# Patient Record
Sex: Female | Born: 1965 | Race: White | Hispanic: No | Marital: Single
Health system: Southern US, Community
[De-identification: ages and names within clinical notes are randomized; demographics above are authoritative.]

## PROBLEM LIST (undated history)

## (undated) DIAGNOSIS — E119 Type 2 diabetes mellitus without complications: Secondary | ICD-10-CM

## (undated) DIAGNOSIS — F419 Anxiety disorder, unspecified: Secondary | ICD-10-CM

## (undated) DIAGNOSIS — I10 Essential (primary) hypertension: Secondary | ICD-10-CM

## (undated) HISTORY — PX: REDUCTION MAMMAPLASTY: SUR839

## (undated) HISTORY — PX: GASTRIC BYPASS: SHX52

---

## 1999-12-12 ENCOUNTER — Other Ambulatory Visit: Admission: RE | Admit: 1999-12-12 | Discharge: 1999-12-12 | Payer: Self-pay | Admitting: *Deleted

## 2000-12-05 ENCOUNTER — Encounter: Payer: Self-pay | Admitting: Emergency Medicine

## 2000-12-05 ENCOUNTER — Inpatient Hospital Stay (HOSPITAL_COMMUNITY): Admission: EM | Admit: 2000-12-05 | Discharge: 2000-12-13 | Payer: Self-pay | Admitting: Emergency Medicine

## 2000-12-05 ENCOUNTER — Encounter: Payer: Self-pay | Admitting: Internal Medicine

## 2000-12-06 ENCOUNTER — Encounter: Payer: Self-pay | Admitting: Internal Medicine

## 2000-12-10 ENCOUNTER — Encounter: Payer: Self-pay | Admitting: Internal Medicine

## 2001-03-26 ENCOUNTER — Other Ambulatory Visit: Admission: RE | Admit: 2001-03-26 | Discharge: 2001-03-26 | Payer: Self-pay | Admitting: *Deleted

## 2002-03-25 ENCOUNTER — Other Ambulatory Visit: Admission: RE | Admit: 2002-03-25 | Discharge: 2002-03-25 | Payer: Self-pay | Admitting: *Deleted

## 2003-07-01 ENCOUNTER — Other Ambulatory Visit: Admission: RE | Admit: 2003-07-01 | Discharge: 2003-07-01 | Payer: Self-pay | Admitting: Obstetrics and Gynecology

## 2005-05-08 ENCOUNTER — Other Ambulatory Visit: Admission: RE | Admit: 2005-05-08 | Discharge: 2005-05-08 | Payer: Self-pay | Admitting: Obstetrics and Gynecology

## 2005-05-12 ENCOUNTER — Ambulatory Visit: Payer: Self-pay | Admitting: Oncology

## 2005-06-27 ENCOUNTER — Ambulatory Visit: Payer: Self-pay | Admitting: Oncology

## 2005-06-27 LAB — CBC WITH DIFFERENTIAL/PLATELET
BASO%: 0.2 % (ref 0.0–2.0)
Basophils Absolute: 0 10*3/uL (ref 0.0–0.1)
EOS%: 2 % (ref 0.0–7.0)
Eosinophils Absolute: 0.2 10*3/uL (ref 0.0–0.5)
HCT: 44.2 % (ref 34.8–46.6)
HGB: 14.9 g/dL (ref 11.6–15.9)
LYMPH%: 28.6 % (ref 14.0–48.0)
MCH: 30.5 pg (ref 26.0–34.0)
MCHC: 33.7 g/dL (ref 32.0–36.0)
MCV: 90.5 fL (ref 81.0–101.0)
MONO#: 0.5 10*3/uL (ref 0.1–0.9)
MONO%: 5.1 % (ref 0.0–13.0)
NEUT#: 6.9 10*3/uL — ABNORMAL HIGH (ref 1.5–6.5)
NEUT%: 64.1 % (ref 39.6–76.8)
Platelets: 344 10*3/uL (ref 145–400)
RBC: 4.88 10*6/uL (ref 3.70–5.32)
RDW: 13.4 % (ref 11.3–14.5)
WBC: 10.7 10*3/uL — ABNORMAL HIGH (ref 3.9–10.0)
lymph#: 3.1 10*3/uL (ref 0.9–3.3)

## 2005-06-30 LAB — HYPERCOAGULABLE PANEL, COMPREHENSIVE
AntiThromb III Func: 110 % (ref 75–120)
Beta-2-Glycoprotein I IgA: 7 U/mL (ref <10)
Beta-2-Glycoprotein I IgM: 5 U/mL (ref <10)
Homocysteine: 7.6 umol/L (ref 4.0–15.4)
Protein S Activity: 135 % (ref 81–180)
Protein S Ag, Total: 112 % (ref 58–146)

## 2005-06-30 LAB — COMPREHENSIVE METABOLIC PANEL
ALT: 36 U/L (ref 0–40)
Albumin: 4.7 g/dL (ref 3.5–5.2)
CO2: 26 mEq/L (ref 19–32)
Calcium: 9.6 mg/dL (ref 8.4–10.5)
Chloride: 101 mEq/L (ref 96–112)
Creatinine, Ser: 0.89 mg/dL (ref 0.40–1.20)
Sodium: 138 mEq/L (ref 135–145)
Total Protein: 7.8 g/dL (ref 6.0–8.3)

## 2005-07-25 ENCOUNTER — Ambulatory Visit: Admission: RE | Admit: 2005-07-25 | Discharge: 2005-07-25 | Payer: Self-pay | Admitting: Gynecologic Oncology

## 2005-08-01 ENCOUNTER — Inpatient Hospital Stay (HOSPITAL_COMMUNITY): Admission: RE | Admit: 2005-08-01 | Discharge: 2005-08-03 | Payer: Self-pay | Admitting: Obstetrics and Gynecology

## 2005-08-01 ENCOUNTER — Encounter (INDEPENDENT_AMBULATORY_CARE_PROVIDER_SITE_OTHER): Payer: Self-pay | Admitting: *Deleted

## 2005-09-05 ENCOUNTER — Ambulatory Visit: Admission: RE | Admit: 2005-09-05 | Discharge: 2005-09-05 | Payer: Self-pay | Admitting: Gynecology

## 2006-03-13 ENCOUNTER — Ambulatory Visit: Admission: RE | Admit: 2006-03-13 | Discharge: 2006-03-13 | Payer: Self-pay | Admitting: Gynecologic Oncology

## 2006-03-13 ENCOUNTER — Encounter (INDEPENDENT_AMBULATORY_CARE_PROVIDER_SITE_OTHER): Payer: Self-pay | Admitting: *Deleted

## 2006-03-13 ENCOUNTER — Other Ambulatory Visit: Admission: RE | Admit: 2006-03-13 | Discharge: 2006-03-13 | Payer: Self-pay | Admitting: Gynecologic Oncology

## 2007-03-07 ENCOUNTER — Encounter: Admission: RE | Admit: 2007-03-07 | Discharge: 2007-03-07 | Payer: Self-pay | Admitting: Internal Medicine

## 2007-03-21 ENCOUNTER — Ambulatory Visit: Payer: Self-pay | Admitting: Pulmonary Disease

## 2007-03-21 DIAGNOSIS — G4710 Hypersomnia, unspecified: Secondary | ICD-10-CM | POA: Insufficient documentation

## 2007-03-21 DIAGNOSIS — I10 Essential (primary) hypertension: Secondary | ICD-10-CM | POA: Insufficient documentation

## 2007-03-21 DIAGNOSIS — G4730 Sleep apnea, unspecified: Secondary | ICD-10-CM

## 2007-03-21 DIAGNOSIS — E785 Hyperlipidemia, unspecified: Secondary | ICD-10-CM

## 2007-03-27 ENCOUNTER — Ambulatory Visit (HOSPITAL_BASED_OUTPATIENT_CLINIC_OR_DEPARTMENT_OTHER): Admission: RE | Admit: 2007-03-27 | Discharge: 2007-03-27 | Payer: Self-pay | Admitting: Pulmonary Disease

## 2007-03-27 ENCOUNTER — Encounter: Payer: Self-pay | Admitting: Pulmonary Disease

## 2007-04-02 ENCOUNTER — Ambulatory Visit: Payer: Self-pay | Admitting: Pulmonary Disease

## 2007-04-02 DIAGNOSIS — G4700 Insomnia, unspecified: Secondary | ICD-10-CM

## 2007-04-08 ENCOUNTER — Encounter: Payer: Self-pay | Admitting: Pulmonary Disease

## 2007-04-10 ENCOUNTER — Ambulatory Visit: Payer: Self-pay | Admitting: Pulmonary Disease

## 2007-04-15 ENCOUNTER — Encounter: Payer: Self-pay | Admitting: Pulmonary Disease

## 2007-04-18 ENCOUNTER — Encounter: Payer: Self-pay | Admitting: Pulmonary Disease

## 2007-05-09 ENCOUNTER — Ambulatory Visit: Payer: Self-pay | Admitting: Pulmonary Disease

## 2007-05-23 ENCOUNTER — Encounter: Payer: Self-pay | Admitting: Pulmonary Disease

## 2007-10-23 ENCOUNTER — Other Ambulatory Visit: Admission: RE | Admit: 2007-10-23 | Discharge: 2007-10-23 | Payer: Self-pay | Admitting: Gynecology

## 2007-10-23 ENCOUNTER — Encounter: Payer: Self-pay | Admitting: Gynecology

## 2007-10-23 ENCOUNTER — Ambulatory Visit: Admission: RE | Admit: 2007-10-23 | Discharge: 2007-10-23 | Payer: Self-pay | Admitting: Gynecology

## 2009-06-28 ENCOUNTER — Ambulatory Visit (HOSPITAL_COMMUNITY)
Admission: RE | Admit: 2009-06-28 | Discharge: 2009-06-28 | Payer: Self-pay | Source: Home / Self Care | Admitting: Surgery

## 2009-07-07 ENCOUNTER — Ambulatory Visit (HOSPITAL_COMMUNITY): Admission: RE | Admit: 2009-07-07 | Discharge: 2009-07-07 | Payer: Self-pay | Admitting: Surgery

## 2009-08-02 ENCOUNTER — Encounter
Admission: RE | Admit: 2009-08-02 | Discharge: 2009-10-15 | Payer: Self-pay | Source: Home / Self Care | Admitting: Surgery

## 2009-09-27 ENCOUNTER — Inpatient Hospital Stay (HOSPITAL_COMMUNITY): Admission: RE | Admit: 2009-09-27 | Discharge: 2009-09-29 | Payer: Self-pay | Admitting: Surgery

## 2009-09-28 ENCOUNTER — Encounter (INDEPENDENT_AMBULATORY_CARE_PROVIDER_SITE_OTHER): Payer: Self-pay | Admitting: Surgery

## 2009-09-28 ENCOUNTER — Ambulatory Visit: Payer: Self-pay | Admitting: Surgery

## 2009-10-12 ENCOUNTER — Encounter: Admission: RE | Admit: 2009-10-12 | Discharge: 2009-10-15 | Payer: Self-pay | Admitting: Surgery

## 2009-11-23 ENCOUNTER — Encounter
Admission: RE | Admit: 2009-11-23 | Discharge: 2010-01-24 | Payer: Self-pay | Source: Home / Self Care | Attending: Surgery | Admitting: Surgery

## 2010-03-31 LAB — CBC
HCT: 36.7 % (ref 36.0–46.0)
Hemoglobin: 13 g/dL (ref 12.0–15.0)
MCH: 31.2 pg (ref 26.0–34.0)
MCH: 31.7 pg (ref 26.0–34.0)
MCHC: 34.2 g/dL (ref 30.0–36.0)
MCV: 90.8 fL (ref 78.0–100.0)
MCV: 91.1 fL (ref 78.0–100.0)
RBC: 4.08 MIL/uL (ref 3.87–5.11)
RBC: 4.26 MIL/uL (ref 3.87–5.11)
RDW: 12.1 % (ref 11.5–15.5)
RDW: 12.9 % (ref 11.5–15.5)
WBC: 13.4 10*3/uL — ABNORMAL HIGH (ref 4.0–10.5)
WBC: 7.9 10*3/uL (ref 4.0–10.5)

## 2010-03-31 LAB — DIFFERENTIAL
Basophils Absolute: 0 10*3/uL (ref 0.0–0.1)
Basophils Absolute: 0.3 10*3/uL — ABNORMAL HIGH (ref 0.0–0.1)
Eosinophils Absolute: 0 10*3/uL (ref 0.0–0.7)
Eosinophils Absolute: 0.2 10*3/uL (ref 0.0–0.7)
Eosinophils Relative: 2 % (ref 0–5)
Lymphocytes Relative: 25 % (ref 12–46)
Lymphs Abs: 1.6 10*3/uL (ref 0.7–4.0)
Lymphs Abs: 3.3 10*3/uL (ref 0.7–4.0)
Monocytes Absolute: 1.1 10*3/uL — ABNORMAL HIGH (ref 0.1–1.0)
Monocytes Relative: 9 % (ref 3–12)
Neutro Abs: 12.1 10*3/uL — ABNORMAL HIGH (ref 1.7–7.7)
Neutro Abs: 5.1 10*3/uL (ref 1.7–7.7)
Neutro Abs: 8.6 10*3/uL — ABNORMAL HIGH (ref 1.7–7.7)
Neutrophils Relative %: 65 % (ref 43–77)
Neutrophils Relative %: 85 % — ABNORMAL HIGH (ref 43–77)

## 2010-03-31 LAB — GLUCOSE, CAPILLARY
Glucose-Capillary: 105 mg/dL — ABNORMAL HIGH (ref 70–99)
Glucose-Capillary: 117 mg/dL — ABNORMAL HIGH (ref 70–99)
Glucose-Capillary: 123 mg/dL — ABNORMAL HIGH (ref 70–99)
Glucose-Capillary: 127 mg/dL — ABNORMAL HIGH (ref 70–99)
Glucose-Capillary: 139 mg/dL — ABNORMAL HIGH (ref 70–99)
Glucose-Capillary: 187 mg/dL — ABNORMAL HIGH (ref 70–99)

## 2010-03-31 LAB — COMPREHENSIVE METABOLIC PANEL
AST: 99 U/L — ABNORMAL HIGH (ref 0–37)
Albumin: 4.3 g/dL (ref 3.5–5.2)
Alkaline Phosphatase: 56 U/L (ref 39–117)
BUN: 9 mg/dL (ref 6–23)
Calcium: 9.5 mg/dL (ref 8.4–10.5)
Glucose, Bld: 125 mg/dL — ABNORMAL HIGH (ref 70–99)
Potassium: 4.3 mEq/L (ref 3.5–5.1)
Sodium: 137 mEq/L (ref 135–145)
Total Bilirubin: 0.8 mg/dL (ref 0.3–1.2)
Total Protein: 8 g/dL (ref 6.0–8.3)

## 2010-03-31 LAB — HEMOGLOBIN AND HEMATOCRIT, BLOOD
HCT: 39.6 % (ref 36.0–46.0)
HCT: 39.9 % (ref 36.0–46.0)
Hemoglobin: 13.6 g/dL (ref 12.0–15.0)

## 2010-03-31 LAB — SURGICAL PCR SCREEN: Staphylococcus aureus: NEGATIVE

## 2010-04-26 ENCOUNTER — Encounter: Admit: 2010-04-26 | Payer: Self-pay | Admitting: Surgery

## 2010-04-26 ENCOUNTER — Ambulatory Visit: Payer: Self-pay | Admitting: *Deleted

## 2010-05-31 NOTE — Procedures (Signed)
NAMEHARRIETT, [name]            ACCOUNT NO.:  [id number]   MEDICAL RECORD NO.:  [id number]          PATIENT TYPE:  OUT   LOCATION:  SLEEP CENTER                 FACILITY:  [facility]   PHYSICIAN:  Oretha Milch, MD      DATE OF BIRTH:  1965-09-26   DATE OF STUDY:  03/27/2007                            NOCTURNAL POLYSOMNOGRAM   REFERRING PHYSICIAN:   INDICATION FOR STUDY:  Witnessed apneas, loud snoring, excessive daytime  somnolence and fatigue in this 45 year old woman with a BMI of 43.  Height 5 feet 1 inch, weight 230 pounds.  Neck size 16 inches..   EPWORTH SLEEPINESS SCORE:  12/24.   MEDICATIONS:  Venlafaxine, benazepril, fish oil and aspirin.   This overnight polysomnogram was performed as a split intervention study  with a sleep technologist in attendance.  EEG, EOG, EMG, EKG and  respiratory data were recorded.  Sleep stages, arousals and respiratory  parameters were scored according to criteria laid out by the American  Academy of Sleep Medicine.   SLEEP ARCHITECTURE:  Lights out was at 2316 p.m.  Sleep latency was 13  minutes during the baseline period.  One hundred thirty-five minutes of  sleep were recorded with a sleep period time of 148.5 minutes and a  sleep efficiency of 83.6%.  No slow wave or REM sleep was noted.  Sleep  stages as a percentage of total sleep time was N1 14.4% and N2 85.6%.  No supine sleep was noted during this period.   During the titration period, 35 minutes of REM (18.5%) was observed.  The longest period of REM sleep was at 4 a.m.  CPAP was initiated at  2:06 a.m.   Arousal data:  During the baseline period, 58 arousals were noted with a  total arousal index of 25.8 events per hour.  Of these, 26 were  spontaneous and 32 were associated with respiratory events.  A few other  arousals were noted during the CPAP titration portion of the study.   RESPIRATORY DATA:  During the baseline period, 26 obstructive apneas and  130 hypopneas were  observed, leading to an AHI of 69.3 events per hour.  The longest apnea duration was 21 seconds and the longest hypopnea  duration was 33.6 seconds.   Due to this degree of respiratory disturbance, CPAP was initiated at +5  cm at 2:06 a.m.  Due to respiratory events and audible snoring, CPAP was  titrated upward.  At a level of +16 cm, she achieved 19.5 minutes of REM  sleep; however, 11 hypopneas and one obstructive apnea were noted at  this level with a desaturation of 86%.   Hence, CPAP was titrated upwards to +18 cm.  At this level for 51  minutes of sleep, she achieved 9 minutes of REM supine sleep.  Only two  hypopneas were noted at this level, with the lowest desaturation of 93%  and an AHI of 2.4 events per hour.  This appears to be the optimal level  during this study.   OXYGEN DATA:  Sixty-eight desaturations were noted during the diagnostic  portion with a low oxygen saturation of 83%.  She spent a total  of 3.8  minutes during the diagnostic portion with a saturation less than 88%.   CARDIAC DATA:  During the diagnostic portion, the low heart rate was  52.4 beats per minute and the high heart rate was 121.2 beats per minute  during non-REM sleep.   MOVEMENT-PARASOMNIA:  Four periodic limb movements were noted with an  index of 1.8 events per hour.   This appears to be an optimal titration as supine and REM sleep were  noted at the final CPAP level and snoring and respiratory disturbance  was eliminated.  She seemed to tolerate the CPAP well.  A ResMed Quattro  medium full-face mask was used.   IMPRESSIONS:  1. Severe obstructive sleep apnea with hypopneas causing sleep      fragmentation and frequent oxygen desaturations.  2. This was eliminated by CPAP  of +18 cm.  Titration was optimal with      supine REM sleep seen at the final level.  3. No evidence of periodic limb movements, cardiac arrhythmias or      behavioral disturbance during sleep.   RECOMMENDATIONS:   1. Treatment options for this degree of sleep-disordered breathing are      weight loss and positive airway pressure.  2. Goal CPAP should be +18 cm.  She was desensitized with a ResMed      Quattro medium full-face mask.  3. CPAP compliance should be emphasized.  She should be cautioned      against driving when sleepy or taking medications with sedative      side effects.      Oretha Milch, MD  Electronically Signed     RVA/MEDQ  D:  04/10/2007 11:29:17  T:  04/10/2007 14:26:08  Job:  [id number]

## 2010-05-31 NOTE — Consult Note (Signed)
NAMESEHAM, GARDENHIRE            ACCOUNT NO.:  [id number]   MEDICAL RECORD NO.:  [id number]          PATIENT TYPE:  OUT   LOCATION:  GYN                          FACILITY:  [facility]   PHYSICIAN:  De Blanch, M.D.DATE OF BIRTH:  1965/09/22   DATE OF CONSULTATION:  DATE OF DISCHARGE:                                 CONSULTATION   CHIEF COMPLAINT:  Endometrial cancer.   INTERVAL HISTORY:  Since her last visit, the patient has done well.  She  denies any GI or GU symptoms.  There is no pelvic pain, pressure,  vaginal bleeding or discharge.  She saw Dr. Henderson Cloud for routine exam as  previously scheduled.  In the interval, she has been found to have  obstructive sleep apnea and is using CPAP with good results.   HISTORY OF PRESENT ILLNESS:  The patient underwent a total abdominal  hysterectomy, bilateral salpingo-oophorectomy, pelvic and periaortic  lymphadenectomy July 2007.  Final pathology showed a grade 2 lesion  invading 1 mm.  She had negative nodes and negative washings (stage IB  grade 2).  She received no adjuvant therapy.   PAST MEDICAL HISTORY:  1. Deep vein thrombosis and pulmonary embolism due to birth control      pills in 2002.  2. Polycystic ovarian syndrome.  3. Hypertension.  4. Obesity.   CURRENT MEDICATIONS:  1. Metformin.  2. Spirolactone.  3. Baby aspirin.   PAST SURGICAL HISTORY:  1. Breast reduction at age 89.  2. Tonsils and adenoidectomy at age 41.  3. TAH-BSO, pelvic and periaortic lymphadenectomy.   SOCIAL HISTORY:  The patient is single.  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  She does not smoke or drink.   DRUG ALLERGIES:  NONE.   FAMILY HISTORY:  Mother with endometrial cancer.  There is no other  gynecologic, breast or colon cancer in the family history.   REVIEW OF SYSTEMS:  A 10-point comprehensive review of systems negative  except as noted above.   PHYSICAL EXAMINATION:  VITAL SIGNS:  Weight 216 pounds.  GENERAL:  The patient is a  healthy, obese white female in no acute  distress.  HEENT:  Negative.  NECK:  Supple without thyromegaly.  There is no supraclavicular or  inguinal adenopathy.  ABDOMEN:  Obese, soft, nontender.  No masses, organomegaly, ascites or  hernias are noted.  Midline incision is well healed.  PELVIC:  EGBUS, vagina, bladder and urethra are normal.  Cervix and  uterus are surgically absent.  Adnexa without masses.  Rectovaginal exam  confirms.  LOWER EXTREMITIES:  Without edema or varicosities.   IMPRESSION:  Stage IB grade 2 endometrial carcinoma.  No evidence of  recurrent disease.   PLAN:  Pap smears were obtained.  The patient has now had 2 years of  followup.  We will lengthen her visits to 6 month intervals.  She will  return to see Dr. Henderson Cloud in 6 months and return to see [place] in 1 year.      De Blanch, M.D.  Electronically Signed     DC/MEDQ  D:  10/23/2007  T:  10/23/2007  Job:  [id number]   cc:   Guy Sandifer. Henderson Cloud, M.D.  Fax: [phone]   Telford Nab, R.N.  [address]  [place], [place] [address]

## 2010-06-03 NOTE — Consult Note (Signed)
[name], [name]            ACCOUNT NO.:  [id number]   MEDICAL RECORD NO.:  [id number]          PATIENT TYPE:  INP   LOCATION:  NA                           FACILITY:  [facility]   PHYSICIAN:  Paola A. Duard Brady, MD    DATE OF BIRTH:  1965/11/18   DATE OF CONSULTATION:  07/25/2005  DATE OF DISCHARGE:                                   CONSULTATION   [name] is a 45 year old gravida 0 who went in for her annual examination  with Dr. Henderson Cloud and a fibroid was palpated.  She went in for her  preoperative evaluation for hysterectomy for fibroids and, at that time,  when she was asked about her last menstrual period, she discussed with Dr.  Henderson Cloud that her last period, per say, was in December 2006, that she has  always had irregular periods, and she has been having intermittent spotting  for quite some time and cannot remember the exact time of her last cycle.  Subsequently, she underwent an endometrial biopsy on June 27 that revealed a  grade 2 endometrioid adenocarcinoma.  She states her cycles have always been  irregular since she went through menarche at the age of 51 or 34.  She was  on birth control pills for a prolonged period time at which time her cycles  were regular.  She went off birth control pills and again started having  irregular bleeding and subsequently started birth control pills again which  were discontinued in November 2002 at which time she was diagnosed with a  left lower extremity DVT, bilateral pulmonary embolisms with right segmental  pulmonary infarction.  She was anticoagulated for six months.  It was felt  that the etiology was most likely secondary to oral contraceptive  medications and obesity as she had unremarkable antithrombin-3, factor V  liden, lupus, anticoagulant, homocysteine, prothrombin gene, and ANA.  She  is, otherwise, doing quite well and denies any significant complaints.   REVIEW OF SYSTEMS:  She denies any chest pain, short of breath,  nausea,  vomiting, fevers, chills, headaches or visual changes.  She denies any  significant change of bowel or bladder habits, any headaches, or night  sweats.  She is currently on her menstrual period.   PAST MEDICAL HISTORY:  DVT, pulmonary embolism secondary to birth control  pills in 2002, polycystic ovarian syndrome, hypertension.   MEDICATIONS:  Metformin, spironolactone and baby aspirin.   PAST SURGICAL HISTORY:  Breast reduction at the age of 1, tonsillectomy at  the age of 28.   SOCIAL HISTORY:  She is single.  She is a fourth grade Engineer, site.  She  denies use of tobacco or alcohol.   ALLERGIES:  None.   FAMILY HISTORY:  Her father had a myocardial infarction at the age of 30.  Her mother had endometrial cancer at the age of 60.  There is coronary  disease and congestive heart failure on both sides her family.  Paternal  grandfather had gastric cancer but he was in the 35s.   HEALTH MAINTENANCE:  She is up to date on her mammograms.   PHYSICAL  EXAMINATION:  VITAL SIGNS:  Height 5 feet, weight 222 pounds, blood pressure 130/88, pulse  80, respirations 18.  GENERAL:  Well developed, well nourished female in no acute distress.  NECK:  Supple, there is no lymphadenopathy, no thyromegaly.  LUNGS:  Clear to auscultation bilaterally.  CARDIOVASCULAR:  Regular rate and rhythm.  HEENT:  Hair is notable for significant alopecia.  ABDOMEN:  Morbidly obese with a moderate pannus, soft, nontender,  nondistended.  There are no palpable masses or hepatosplenomegaly.  Exam is  limited by habitus.  Groins are negative for adenopathy.  EXTREMITIES:  No edema.  PELVIC:  External genitalia is within normal limits.  The vagina is well  epithelized.  The cervix is nulliparous.  There is menstrual flow.  There is  no gross visible lesions.  Bimanual examination reveals the corpus is  approximately 12 weeks size.  The cervix is palpably normal.  I cannot  appreciate the size of the  adnexa.  RECTAL:  Confirms there is no parametrial involvement.   ASSESSMENT:  80.  45 year old with a grade 2 endometrial carcinoma who is scheduled for      TAH-BSO and appropriate staging on July 17 with Dr. Katheren Shams-      Pearson and Dr. Harold Hedge.  The patient is aware that I will not be      performing the surgery and is comfortable with Dr. Henderson Cloud and Dr.      Stanford Breed performing it.  I discussed with her coming off her baby      aspirin, which she stated that Dr. Henderson Cloud preferred that she stay on      it.  The risks and benefits of the surgery were discussed with the      patient, she wishes to proceed. Specific risks that were discussed      included deep venous thrombosis and pulmonary embolism.  I discussed      with her that she will most likely by on prophylactic dose Lovenox and a      consideration for a prolonged postoperative anticoagulation would need      to be held pending the operative findings.  We discussed what surgical      staging for endometrial carcinoma entailed.  Her questions were elicited      and answered to her satisfaction.  2.  She will undergo the routine preoperative care.  3.  She was given my card as well as that of Telford Nab.  She knows      that she can contact us prior to the surgery should she have any other      questions.      Paola A. Duard Brady, MD  Electronically Signed     PAG/MEDQ  D:  07/25/2005  T:  07/25/2005  Job:  [id number]   cc:   Deirdre Peer. Polite, M.D.   Blenda Nicely. Beau Fanny Henderson Cloud, M.D.  Fax: [phone]   Telford Nab, R.N.  [address]  [place], [place] [address]

## 2010-06-03 NOTE — H&P (Signed)
[facility]  Patient:    [name], [name] Visit Number: [id number] MRN: [id number]          Service Type: MED Location: 2S 0256 01 Attending Physician:  Anastasio Auerbach Dictated by:   Renford Dills, M.D. Admit Date:  12/05/2000                           History and Physical  CHIEF COMPLAINT:  Shortness of breath, "I cant catch my breath."  HISTORY OF PRESENT ILLNESS:  The patient is a 45 year old white woman with history of anemia, presumed iron deficient secondary to menorrhagia on birth control x 11 months who was in her usual state of health until this weekend prior to admission when she had subjective complaints of shortness of breath and exertional dyspnea. This, she noted on Saturday while walking to a concert, just continued symptoms of shortness of breath. When she got and sat in the concert, the patient stated that her symptoms were better when she was not moving around. However, after the concert, when had to walk back to the car, again noticed symptoms of shortness of breath. They were worsened by exertion. At no time, the patient admitted to having any chest pain, palpitations, nausea, or vomiting. Did not have any upper respiratory symptoms either. The patient was seen by M.D. over the weekend, had blood work drawn which was consistent with anemia, hemoglobin of approximately 8.5. The patient had a chest x-ray at that time which was reported as negative. The patient was given therapy for iron-deficiency anemia and anxiolytic. At that time, the patient did have an EKG that showed some tachycardia. The patient was able to go to work on Monday and Tuesday. However, today, again shortness of breath worse with exertion. Today, noticed some dizziness and felt as if she was going to faint. On repeat questioning, the patient denied any chest pains or palpitations. Of note, the patient does take birth control pills. However, she denies any swelling in  her legs but does note some vague discomfort in her left calf in the posterior aspect behind the knee. The patient does not have any significant family history of any clotting disorder or DVT or PE. Again, the patient denies any upper respiratory infection. No cough. Denies any recent trauma to her legs. In the ED, the patient was evaluated and found to have sinus tachycardia. She was saturating 94% on room air. After discussion with one of the [facility] hospitalists, the patient had further studies ordered, and it was deemed necessary to admit the patient for further evaluation.  PAST MEDICAL HISTORY:  As stated above. Menorrhagia. However, per the patient, she states she is not having menstrual flow since September of 2002. At that time, she had a heavy menstrual flow x 1 month when she states she had to use approximately 80 pads. In October of 2002, the patient states she had normal menstrual flow and at this time has not had any menstrual flow for the month of November, and per the patient said she was started on birth control pills last December and she has not had any trouble with menorrhagia. The patient also has a past medical history of right knee ACL and medial meniscus tear. The patient denies any problems with high blood pressure, diabetes. No lung disease.  MEDICATIONS ON ADMISSION:  Iron and anxiolytic alprazolam. This was just started in November 2002. The patient denies any aspirin. Does take occasional Advil probably  one to two a week.  SOCIAL HISTORY:  The patient denies any tobacco use x 2 years. Prior to that, one pack per week for approximately two years and prior to that rarely smoking. Denies any alcohol. Denies any drugs.  PAST SURGICAL HISTORY:  Negative for appendectomy, cholecystectomy. The patient does admit to breast reduction in 1984.  ALLERGIES:  No known drug allergies.  FAMILY HISTORY:  Father significant for coronary artery disease, mother  for endometrial cancer.  REVIEW OF SYSTEMS:  The patient denies any headache. Does admit to some dizziness with exertion. No chest pain. Positive shortness of breath. No palpitations. Does not complain of any abdominal pain. Of note, the patient normally can walk greater than five blocks without problem or go up greater than two flights of stairs without any problems. The patient denies any urinary symptoms. No hematuria. No bright red blood per rectum.  PHYSICAL EXAMINATION:  GENERAL:  The patient is in moderate distress secondary to her dyspnea.  VITAL SIGNS:  Saturating 94% on room air, temperature 97, blood pressure 125/72, pulse 130 to 140 sinus.  HEENT:  Pupils are equal, round, and reactive to light. Anicteric. Pale sclerae. No oral lesions.  NECK:  No nodes. No JVD.  CHEST:  Increased breath sounds in right base. No rales.  CARDIOVASCULAR:  Sinus tachycardia. No murmurs noted.  BREASTS:  No mass. No adenopathy. Surgical scars consistent with a breast reduction.  ABDOMEN:  Nontender. No hepatosplenomegaly. Positive bowel sounds.  EXTREMITIES:  No clubbing, cyanosis, or edema. The patient has good capillary refill. No calf pain. No cord appreciated. Negative Homans sign.  NEUROLOGICAL:  Nonfocal.  RECTAL:  Negative. No mass.  LABORATORY DATA:  Hemoglobin 8.5, MCV of 58. UA:  Specific gravity 1.025, no glucose, positive for ketones, positive for protein, LE and nitrite negative. Urine pregnancy test is negative. BMP essentially normal.  EKG:  Sinus tachycardia. No Qs. No ST elevation.  Chest x-ray:  No pneumonia. No effusion. No cardiomegaly.  ASSESSMENT AND PLAN: 1. The patient is a 45 year old white woman known history of anemia now with    progressive dyspnea on exertion, tachypnea, tachycardia, with positive    birth control pill use. At this time, the patient is hemodynamically stable    but must rule out pulmonary embolism before taking on any other  medical    evaluation. The patient will have a STAT spiral CAT scan and consider an    ABG to evaluate hypoxia.  2. Tachycardia. Differential diagnosis secondary to PE, must also exclude    thyroid disease, hypovolemia. Must also excludes drugs, ischemia. However,    this is less likely. Anxiety also this is less likely. As stated, we will    check a spiral CAT scan to rule out PE. We will give IV fluids, check    thyroid function tests. We will follow up with H&H, transfuse to the    patients hemoglobin greater than 8.5. We also will check a drug screen. It    is felt that the patients current level of anemia is not likely to be the    cause of her tachycardia. 3. Anemia presumed secondary to iron deficiency secondary to menorrhagia. Will    check the patients iron studies. The patient may need to receive IV iron.    We will check a uterine ultrasound to rule out pathology for cause of her    menorrhagia. The patient stated that was not any family history of any  problems with anemia. We must still keep that in mind to rule out other    causes for her anemia. 4. The patient elevated white blood cell count. However, the patient is    afebrile. The differential diagnosis is stress which is less likely,    pneumonia. The patient did have a clear x-ray. Must also rule out urinary    tract infection or an occult process such as an abscess. Must also be    concerned for bacteremia. However, the patient again is not febrile. With    the patients age, she should be able to mount a febrile response    bacteremia. Will check studies. Rule out pulmonary process, rule out UTI.    At this time, we will hold antibiotics until more likely etiology is    identified. We will make further recommendations after evaluation of the    above studies.  Dictated by:   Renford Dills, M.D. Attending Physician:  Anastasio Auerbach DD:  12/05/00 TD:  12/05/00 Job: [id number]

## 2010-06-03 NOTE — Discharge Summary (Signed)
NAMECHIYO, FAY            ACCOUNT NO.:  [id number]   MEDICAL RECORD NO.:  [id number]          PATIENT TYPE:  INP   LOCATION:  1612                         FACILITY:  [facility]   PHYSICIAN:  Guy Sandifer. Henderson Cloud, M.D. DATE OF BIRTH:  [birth year]   DATE OF ADMISSION:  08/01/2005  DATE OF DISCHARGE:  08/03/2005                                 DISCHARGE SUMMARY   ADMISSION DIAGNOSIS:  Endometrial adenocarcinoma.   DISCHARGE DIAGNOSIS:  Endometrial adenocarcinoma.   PROCEDURE:  On October 02, 2005, total abdominal hysterectomy with  bilateral salpingo-oophorectomy, pelvic and para-aortic lymphadenectomy.   REASON FOR ADMISSION:  This patient is a 45 year old, single, white female,  G0, P0 with uterine leiomyomata.  She was found to have a grade 2  endometrial adenocarcinoma upon endometrial biopsy in the office.  Consultation was then undertaken with the GYN cancer service.  She is being  admitted for surgical management.   HOSPITAL COURSE:  The patient is taken to the operating room and underwent  the above procedure.  Estimated blood loss is 150 mL.  On the evening of  surgery, she has good pain relief, is ambulating with stable vital signs and  is afebrile.  Urine output is clear.  On postop day #1, she is tolerating  regular diet.  She is ambulating.  Hemoglobin is 13.5, white count 15,000  and platelets 328,000.  The patient has a history of DVT and receives  Lovenox 40 mg subcu each morning.  On the day of discharge, she is  ambulating well and has stable vital signs.  Pathology returns consistent  with a FIGO grade 2, moderately differentiated endometrial carcinoma with  superficial invasion.  She had 0/21 nodes positive.   CONDITION ON DISCHARGE:  Good.   DIET:  Regular as tolerated.   ACTIVITY:  No lifting.  No operation of automobiles.  No vaginal entry.   SPECIAL INSTRUCTIONS:  She is to call the office for problems including, but  not limited to, temperature of 101  degrees, heavy vaginal bleeding,  increasing pain or persistent nausea or vomiting.   DISCHARGE MEDICATIONS:  1.  Lovenox 40 mg subcu q.a.m. x12 doses.  2.  Percocet 5/325 mg, #40, one to two p.o. q.6h. p.r.n.   FOLLOW UP:  Follow up in the office in 1 week.      Guy Sandifer Henderson Cloud, M.D.  Electronically Signed     JET/MEDQ  D:  08/03/2005  T:  08/03/2005  Job:  [id number]   cc:   De Blanch, M.D.  [address]  [place]  [place] [address]

## 2010-06-03 NOTE — Op Note (Signed)
NAMEJAYMARIE, Villarreal            ACCOUNT NO.:  [id number]   MEDICAL RECORD NO.:  [id number]          PATIENT TYPE:  INP   LOCATION:  1612                         FACILITY:  [facility]   PHYSICIAN:  De Blanch, M.D.DATE OF BIRTH:  [birth year]   DATE OF PROCEDURE:  08/01/2005  DATE OF DISCHARGE:                                 OPERATIVE REPORT   PREOPERATIVE DIAGNOSIS:  Grade II endometrial adenocarcinoma, uterine  fibroids.   POSTOPERATIVE DIAGNOSIS:  Grade II endometrial adenocarcinoma, uterine  fibroids.   PROCEDURE:  Total abdominal hysterectomy bilateral salpingo-oophorectomy,  pelvic and periaortic lymphadenectomy.   SURGEON:  De Blanch, M.D.   FIRST ASSISTANT:  Harold Hedge, MD; Telford Nab, R.N.   ANESTHESIA:  General with orotracheal tube.   ESTIMATED BLOOD LOSS:  150 mL.   SURGICAL FINDINGS:  At the time of exploratory laparotomy, the upper abdomen  including liver, spleen, stomach, omentum, small and large bowel were  normal.  There were no enlarged pelvic or periaortic lymph nodes.  The  uterus was enlarged to approximately 14 weeks' gestational size.  The  ovaries appeared to be polycystic.  There is no evidence of extrauterine  metastases.  On frozen section, pathologist told us that the cancer was  minimally invasive.   PROCEDURE:  The patient brought to the operating room and after satisfactory  attainment of general anesthesia, was placed in modified lithotomy position  in Latta stirrups.  The anterior abdominal wall, perineum and vagina were  prepped with Betadine and Foley catheter was inserted and the patient was  draped.  The abdomen was entered through a left paramedian incision.  Peritoneal washings were obtained from the pelvis.  The abdomen and pelvis  were explored with the above-noted findings.  Bookwalter retractor was  assembled and the bowel packed out the pelvis.  The patient was very obese  and exposure was difficult.   Ultimately the uterus was grasped with long  Kelly clamps.  The round ligament was divided and the retroperitoneal spaces  were opened identifying the vessels and ureter.  The ovarian vessels were  skeletonized, clamped, cut, free tied and suture ligated using 2-0 Vicryl.  Same procedure was performed on both sides of the pelvis.  The bladder flap  was advanced after the peritoneum was incised.  The uterine vessels were  skeletonized, clamped, cut and suture ligated.  In a stepwise fashion the  paracervical and cardinal ligaments were clamped and suture ligated.  Vaginal angle was identified, crossclamped and divided and the vagina  incised at its junction with the cervix, uterus, cervix, tubes and ovaries  were sent to pathology for frozen section with the above-noted report.  Vaginal angles were transfixed and the central portion of the vagina closed  with interrupted figure-of-eight sutures of 0 Vicryl.   Pelvic lymphadenectomy was performed excising lymph nodes off the external  iliac artery and vein, internal iliac artery and obturator fossa.  Throughout the procedure, care was taken to avoid injury to the vessels and  the obturator nerve.  Hemostasis achieved with cautery and hemoclips.  Packs  were placed in the pelvis and attention was  turned to the periaortic region.   The Bookwalter retractor was repositioned and the abdominal incision  extended so as to expose the region overlying the bifurcation of the aorta  and the lower aorta.  Bookwalter retractor was reassembled and the  peritoneum overlying the right common iliac artery was exposed.  An incision  was made in the peritoneum overlying the common iliac artery and along the  aorta.  A retroperitoneal dissection was conducted was performed, mobilizing  the right ureter laterally.  The dissection was carried up underneath the  retroperitoneal portion of the duodenum which was then reflected cephalad.  Dissection was then  carried along the aorta.  The vena cava was exposed and  lymph nodes removed off the vena cava and aorta.  Hemostasis again was  achieved with cautery and hemoclips.  This region was inspected and found to  be hemostatic.   The pelvis was re-exposed, found to be hemostatic and then irrigated.  The  packs and retractors removed.  The anterior abdominal wall was closed in  layers, the first being a running mass closure using #1 PDS.  Subcutaneous  tissue was irrigated.  Hemostasis achieved with cautery.  Skin was  reapproximated with skin staples and dressing was applied.  The patient was  awakened from anesthesia, taken to the recovery room in satisfactory  condition.  Sponge, needle and instrument counts correct x2.      De Blanch, M.D.  Electronically Signed     DC/MEDQ  D:  08/01/2005  T:  08/01/2005  Job:  [id number]   cc:   Guy Sandifer. Henderson Cloud, M.D.  Fax: [phone]   Telford Nab, R.N.  [address]  [place], [place] [address]   Deirdre Peer. Polite, M.D.   Blenda Nicely. [employer]

## 2010-06-03 NOTE — Consult Note (Signed)
[name], BRAMMER            ACCOUNT NO.:  [id number]   MEDICAL RECORD NO.:  [id number]          PATIENT TYPE:  OUT   LOCATION:  GYN                          FACILITY:  [facility]   PHYSICIAN:  De Blanch, M.D.DATE OF BIRTH:  07/11/1965   DATE OF CONSULTATION:  09/08/2005  DATE OF DISCHARGE:  09/05/2005                                   CONSULTATION   CHIEF COMPLAINT:  Endometrial cancer, postoperative follow-up.   INTERVAL HISTORY:  Since her hospital discharge, the patient has done well.  She has returned to nearly a normal functional status.  She denies any  significant pelvic pain or pressure, vaginal bleeding or discharge.   She underwent a total abdominal hysterectomy and bilateral salpingo-  oophorectomy, pelvic and paraortic lymphadenectomy August 01, 2005.  Final  pathology showed a moderately-differentiated adenocarcinoma invading 0.1 cm  with negative nodes and negative washings (stage I-B, grade 2).   PHYSICAL EXAMINATION:  VITAL SIGNS:  Weight 208 pounds.  ABDOMEN:  Soft and nontender.  No mass, organomegaly, ascites or hernia is  noted.  The midline incision is healing well.  PELVIC:  EG, BUS, vagina, bladder and urethra are normal.  Cervix and uterus  surgically absent.  The cuff is healing well.  No lesions are noted.  Bimanual and rectovaginal exam reveal no masses, induration or nodularity.  She has minimal postoperative induration.   IMPRESSION:  Excellent postoperative recovery.  The patient was given the  okay to return to full levels of activity.   I discussed her pathology report with her and indicated that she has a very  excellent prognosis and would not recommend any other adjuvant therapy.  She  will return to see Dr. Henderson Cloud in 3 months and return to see [place] in 6 months.      De Blanch, M.D.  Electronically Signed     DC/MEDQ  D:  09/08/2005  T:  09/08/2005  Job:  [id number]   cc:   Guy Sandifer. Henderson Cloud, M.D.  Fax: [phone]   Telford Nab, R.N.  [address]  [place], [place] [address]

## 2010-06-03 NOTE — Discharge Summary (Signed)
[facility]  Patient:    [name], [name] Visit Number: [id number] MRN: [id number]          Service Type: MED Location: 3W [id number] Attending Physician:  [name] Dictated by:   [name], M.D. Admit Date:  12/05/2000 Disc. Date: 12/13/00   CC:         [name], M.D.  [name]. Vaughan Basta., M.D.   Discharge Summary  DATE OF BIRTH:  02-09-1965  CONSULTATIONS:  None.  PROCEDURES:  None.  DISCHARGE DIAGNOSES: 1. Left lower extremity deep vein thrombosis, bilateral pulmonary embolism    with right segmental pulmonary infarction.    a. Anticoagulation initiated December 06, 2000, x 6 months.    b. Etiology likely secondary to oral contraceptive pills and obese habitus       (unremarkable hypercoagulability including antithrombin III, factor V       Leiden, lupus anticoagulant, protein CNS total/functional, homocystine,       prothrombin gene, ANA). 2. Polycystic ovarian disease, followed by Dr. Malachy Villarreal. 3. Menometrorrhagia with known fibroid uterus by vaginal ultrasound this    admission. 4. Anemia, microcytic, iron deficient, presumed secondary to metrorrhagia    (admission hemoglobin 8.5, peak 10.6 after two-unit packed red blood cell    transfusion, 9.3 at discharge, total iron less than 10). 5. Anxiety disorder not otherwise specified. 6. History of right knee anterior cruciate ligament repair and medial    meniscal tear repair.  DISCHARGE MEDICATIONS: 1. Coumadin 5 mg alternating with 7.5 mg every other day, to be adjusted by    frequent INRs beginning 24 hours after discharge. 2. Vioxx 25 mg p.o. q.d. x 5 days. 3. Pepcid 20 mg p.o. b.i.d. x 5 days. 4. Iron tablets as prior to admission.  FOLLOW-UP:  INR/PT Friday, December 14, 2000, [facility].  Appointment with Dr. Merril Villarreal (new patient) December 18, 2000, 3 p.m.  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  She was evaluated at [facility], where a chest x-ray and EKG were obtained; findings most notable for tachycardia at 130.  She returned to work but continued to be troubled by exertional dyspnea and eventually became dizzy and presyncopal. Further history revealed report of vague discomfort in the posterior calf and popliteal space, although there had been no chest pain.  She presented to the emergency department for further assistance, where [name] on unassigned call evaluated her with concern over DVT and pulmonary embolus. For further details of admission history and physical, please see his dictated report.  HOSPITAL COURSE: #1 - LEFT LOWER EXTREMITY DEEP VEIN THROMBOSIS, BILATERAL PULMONARY EMBOLUS WITH RIGHT SEGMENTAL PULMONARY INFARCTION:  [name] oxygen saturation was 94% on room air with blood pressure 125/72 and pulse 145 with tachycardia. Decreased breath sounds in the right base were noted without crackles.  No rub was apparent.  EKG confirmed sinus tachycardia, and chest x-ray was unremarkable.  Spiral CT of the chest and lower extremities documented extensive bilateral pulmonary emboli and evidence of a DVT in the left popliteal vein.  Standardized heparin was initiated immediately.  Severe pleuritic chest pain developed during her hospitalization, requiring high doses of anti-inflammatory and narcotic analgesics.  Her symptoms responded appropriately and,  at the time of discharge, she is no longer experiencing pain nor dyspnea at rest or with exertion.  There has been no lower extremity edema, and discomfort has resolved.  Coumadin was initiated per protocol, to which she responded slowly.  She will be discharged on December 13, 2000, after a  48-hour therapeutic overlap with heparin.  Six-month anticoagulation is recommended.  Evaluation for hypercoagulability was negative.  Conclusion is thromboembolic disorder secondary to obese habitus with concurrent oral contraceptive pills and some report of prolonged immobility in the form of car ride.  Oral contraceptives have been discontinued.  #2 - POLYCYSTIC OVARIAN DISEASE:  [name] has been followed by Dr. Malachy Villarreal, who is currently out on medical leave.  She has been reassigned to another gynecologist in the practice but has not been able to schedule an appointment.  She will do this shortly following discharge for discussion of alternative therapies for her polycystic ovarian disease.  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  Iron levels were markedly low, although she has tolerated this well given chronicity.  She was transfused two units of packed red blood cells and will continue iron supplementation which should continue aggressively.  She is very interested in hysterectomy for control of dysfunctional uterine bleeding and will discuss this with her gynecologist. She is willing to postpone this decision until her six-month anticoagulation period has been completed. Dictated by:   [name], M.D. Attending Physician:  [name] DD:  12/12/00 TD:  12/12/00 Job: [id number]

## 2010-06-03 NOTE — Consult Note (Signed)
NAMELINDAMARIE, [name]            ACCOUNT NO.:  [id number]   MEDICAL RECORD NO.:  [id number]          PATIENT TYPE:  OUT   LOCATION:  GYN                          FACILITY:  [facility]   PHYSICIAN:  Paola A. Duard Brady, MD    DATE OF BIRTH:  [birth year]   DATE OF CONSULTATION:  03/13/2006  DATE OF DISCHARGE:                                 CONSULTATION   HISTORY:  Ms. Tessler is a 45 year old gravida 0 who underwent an  endometrial biopsy in June 2007 that revealed a grade 2 endometrioid  adenocarcinoma.  She was subsequently referred to [place] and underwent a TAH-  BSO and appropriate staging.  Final pathology was consistent with a  stage IB grade 2 endometrioid adenocarcinoma with 0.1 cm depth of  myometrial invasion where the myometrium measured 2.3 cm, the cervix and  adnexa were negative.  She had 0/21 lymph nodes involved.  There was no  lymphovascular space involvement.  She was dispositioned to close follow-  up.  She was last seen by Dr. Stanford Breed for a postoperative check  in August 2007.  She states that she saw Dr. Henderson Cloud, 3 months ago; she  is not sure if a Pap smear was performed; but per her report, there has  been no evidence of recurrent disease.  She does have a history of a  left lower extremity DVT, bilateral pulmonary embolisms, with a right  segmental pulmonary infarction, that was felt to be secondary to birth  control pills and obesity; as she had a thrombophilia workup which was  completely negative.  She had no issues with thromboembolic disease  during her surgery.  She is otherwise doing quite well; denies any  bleeding, any abdominal pain, any nausea, vomiting, fevers, chills, or  change in bowel or bladder habits.   MEDICATIONS:  Zocor, benazepril, calcium, and fish oil.   PHYSICAL EXAMINATION:  VITAL SIGNS:  Weight 216 pounds.  Blood pressure  130/80.  GENERAL:  A well-nourished well-developed female in no acute distress.  NECK:  Supple.  There is  lymphadenopathy, no thyromegaly.  ABDOMEN:  Shows a well-healed vertical skin incision.  There is no  evidence of incisional hernia.  Abdomen is soft and nontender.  There is  no palpable masses though the exam is limited by habitus.  Groins are  negative for adenopathy.  EXTREMITIES:  There is no edema.  PELVIC:  External genitalia is within normal limits.  Per the vagina  slightly atrophic.  The vaginal cuff is visualized; there are no gross  visible lesions.  ThinPrep Pap was submitted without difficulty.  Bimanual examination reveals no masses or nodularity.  Rectal confirms.   ASSESSMENT:  A 45 year old with a stage I B grade 2 endometrioid  adenocarcinoma who has no evidence of recurrent disease.   PLAN:  She will follow up with Dr. Henderson Cloud in 3 months and return to see  [place] in 6 months.  At that time she will be 1 year out; and we can follow  up with her on a every 54-month basis.      Paola A. Duard Brady, MD  Electronically Signed  PAG/MEDQ  D:  03/13/2006  T:  03/13/2006  Job:  [id number]   cc:   Guy Sandifer. Henderson Cloud, M.D.  Fax: [phone]   Telford Nab, R.N.  [address]  [place], [place] [address]   Gaspar Garbe, M.D.  Fax: [phone]

## 2010-06-03 NOTE — H&P (Signed)
[name], [name]            ACCOUNT NO.:  [id number]   MEDICAL RECORD NO.:  [id number]          PATIENT TYPE:  INP   LOCATION:  NA                           FACILITY:  [facility]   PHYSICIAN:  Cheryl Sandifer. Henderson Villarreal, M.D. DATE OF BIRTH:  1965/04/05   DATE OF ADMISSION:  08/01/2005  DATE OF DISCHARGE:                                HISTORY & PHYSICAL   CHIEF COMPLAINT:  Endometrial cancer.   HISTORY OF PRESENT ILLNESS:  The patient is a 45 year old single white  female G0, P0 with a known uterine leiomyoma measuring 11.5 cm on ultrasound  in April 2007.  After consideration of the options she was scheduled for a  total abdominal hysterectomy.  Endometrial biopsy done at her preoperative  visit was consistent with a FIGO grade 2 endometrioid adenocarcinoma.  Consultation with Dr. Stanford Villarreal was then undertaken.  She is being  admitted for total abdominal hysterectomy, possible bilateral salpingo-  oophorectomy, possible staging procedures.  Potential risks and  complications have been discussed with the patient preoperatively.   PAST MEDICAL HISTORY:  1.  Chronic hypertension.  2.  Diabetes.  3.  History of DVT while on the birth control pill.   PAST SURGICAL HISTORY:  1.  Breast reduction 1984.  2.  Tonsillectomy age 43.   MEDICATIONS:  Metformin, spironolactone and baby aspirin.   ALLERGIES:  NO KNOWN DRUG ALLERGIES.   FAMILY HISTORY:  Remarkable for myocardial infarction in father, endometrial  cancer in mother, chronic hypertension mother and father, gastric cancer  paternal grandfather.   SOCIAL HISTORY:  Denies tobacco, alcohol or drug abuse.   REVIEW OF SYSTEMS:  NEURO:  Denies headache.  CARDIO:  Denies chest pain.  PULMONARY:  Denies shortness of breath.  GI:  Denies recent changes in bowel  habits.   PHYSICAL EXAMINATION:  VITAL SIGNS:  Height 5 feet 0 inches.  Weight 209  pounds.  Blood pressure 146/98.  HEENT:  Without thyromegaly.  LUNGS:  Clear to  auscultation.  HEART:  Regular rate and rhythm.  BACK:  Without CVA tenderness.  BREASTS:  Without mass, retractions or discharge.  ABDOMEN:  Obese, soft, nontender without masses.  PELVIC EXAM:  Vulva, vagina, cervix without lesion.  Uterus is 14-16 weeks  in size with exam compromised by patient habitus.  Adnexal exam totally  compromised.  EXTREMITIES:  Grossly within normal limits.  NEUROLOGICAL:  Grossly within normal limits.   ASSESSMENT:  Grade 2 endometrial carcinoma.   PLAN:  Total abdominal hysterectomy, possible bilateral salpingo-  oophorectomy, possible staging procedures with Dr. Stanford Villarreal.      [name], M.D.  Electronically Signed     JET/MEDQ  D:  07/31/2005  T:  07/31/2005  Job:  [id number]

## 2011-03-21 ENCOUNTER — Telehealth (INDEPENDENT_AMBULATORY_CARE_PROVIDER_SITE_OTHER): Payer: Self-pay | Admitting: Surgery

## 2011-03-21 NOTE — Telephone Encounter (Signed)
03/21/11 mailed recall letter for bariatric surgery follow-up. Advised the patient to call [facility] @ [phone] to schedule an appointment...cef °

## 2011-11-02 ENCOUNTER — Other Ambulatory Visit: Payer: Self-pay | Admitting: Obstetrics and Gynecology

## 2012-02-23 IMAGING — RF DG UGI W/ GASTROGRAFIN
14 series · 14 of 14 positions shown · IV contrast (agent unspecified)
Comparison: None

CLINICAL DATA: Gastric bypass

WATER SOLUBLE UPPER GI SERIES
TECHNIQUE: Single-column upper GI series was performed using water
soluble contrast.
Fluoroscopy Time: Less than 2 minutes
Contrast: 50 ml of water soluble contrast

[Series 1: run · 1 of 1 slices shown (1 of 14)]
[im 1/1]
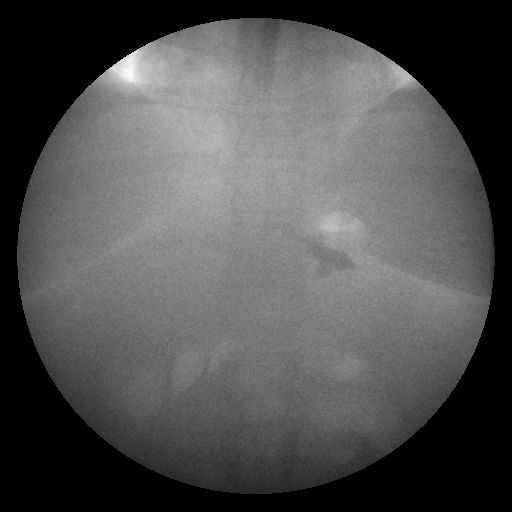

[Series 2: run · 1 of 1 slices shown (2 of 14)]
[im 1/1]
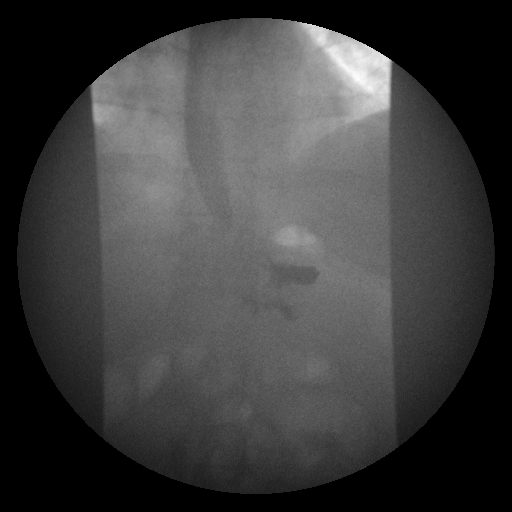

[Series 3: run · 1 of 1 slices shown (3 of 14)]
[im 1/1]
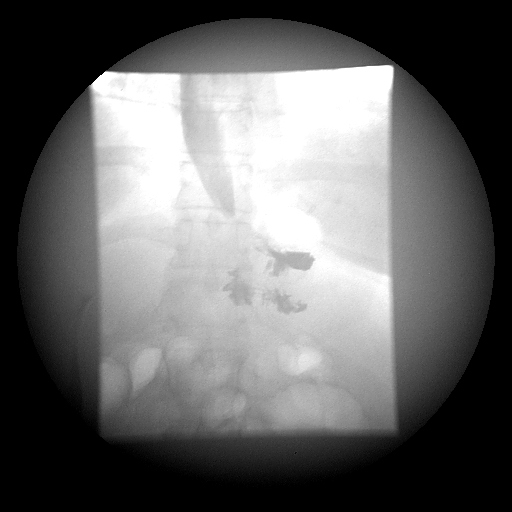

[Series 4: run · 1 of 1 slices shown (4 of 14)]
[im 1/1]
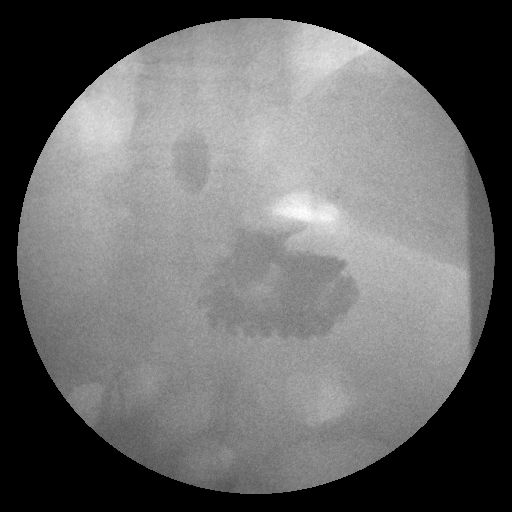

[Series 5: run · 1 of 1 slices shown (5 of 14)]
[im 1/1]
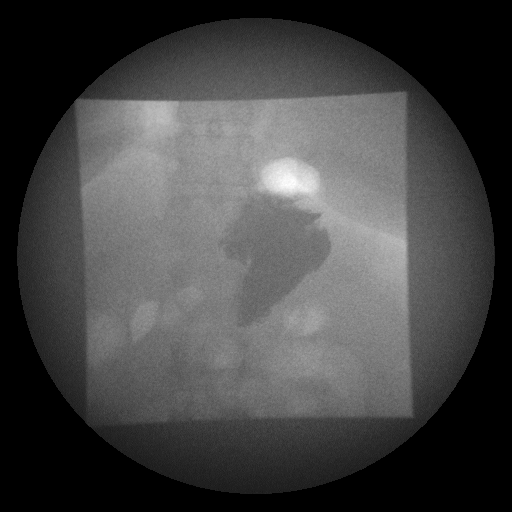

[Series 6: run · 1 of 1 slices shown (6 of 14)]
[im 1/1]
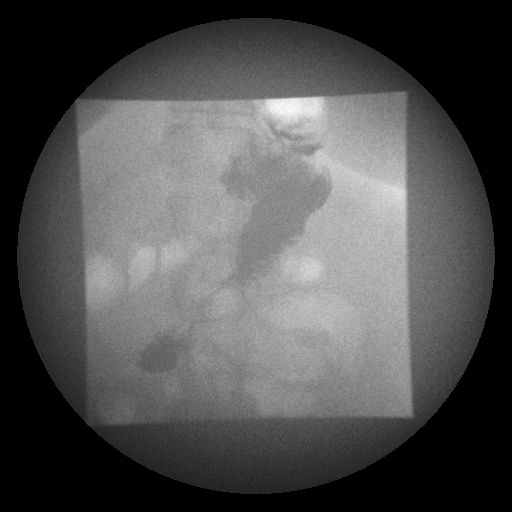

[Series 7: run · 1 of 1 slices shown (7 of 14)]
[im 1/1]
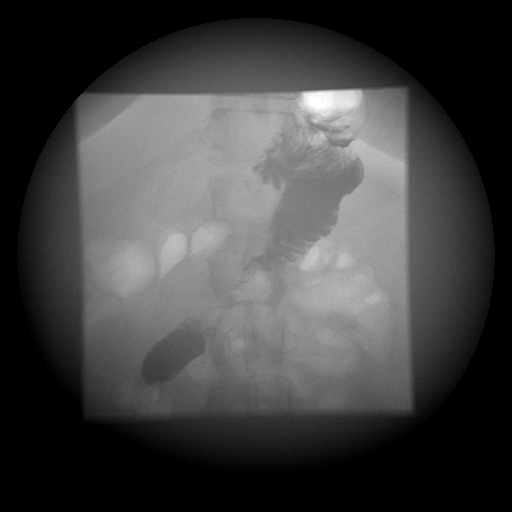

[Series 8: run · 1 of 1 slices shown (8 of 14)]
[im 1/1]
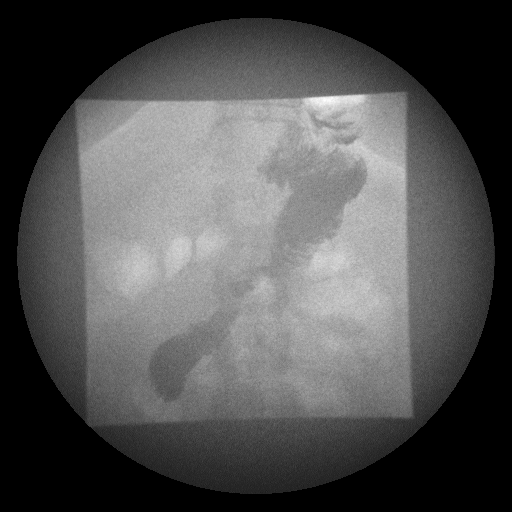

[Series 9: run · 1 of 1 slices shown (9 of 14)]
[im 1/1]
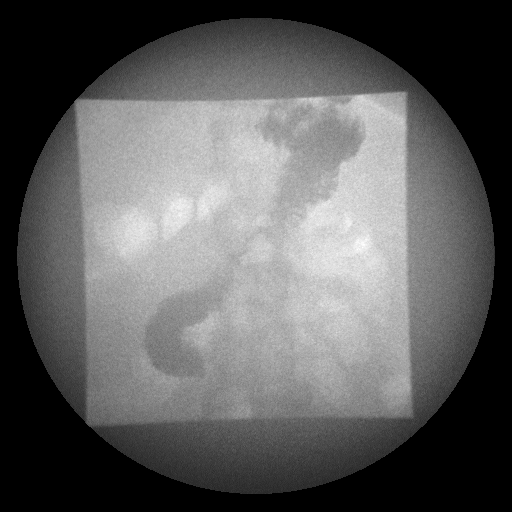

[Series 10: run · 1 of 1 slices shown (10 of 14)]
[im 1/1]
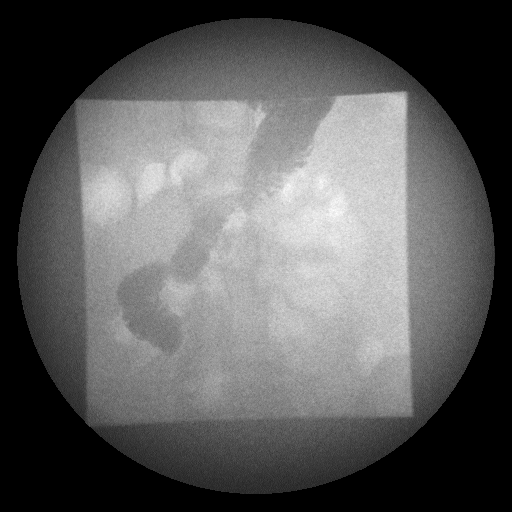

[Series 11: run · 1 of 1 slices shown (11 of 14)]
[im 1/1]
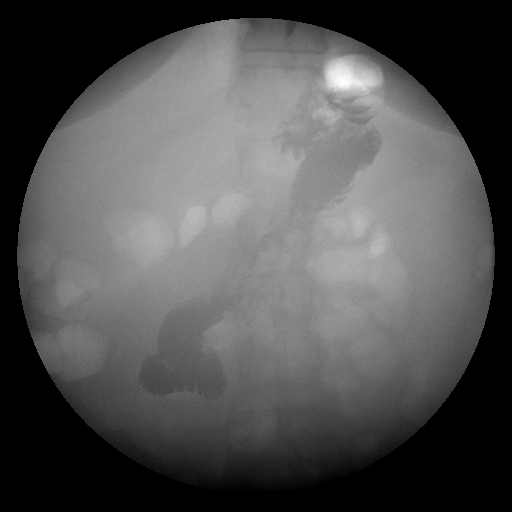

[Series 12: run · 1 of 1 slices shown (12 of 14)]
[im 1/1]
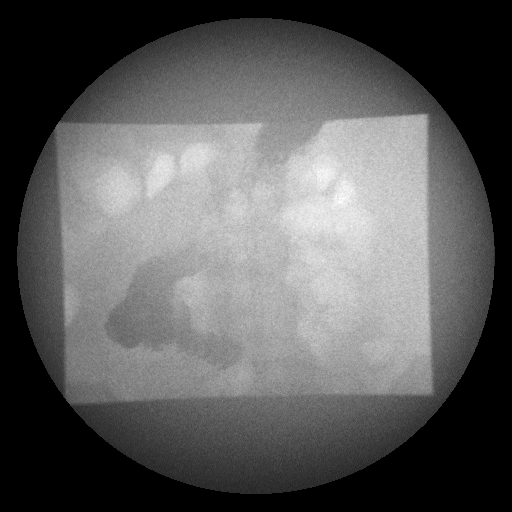

[Series 13: run · 1 of 1 slices shown (13 of 14)]
[im 1/1]
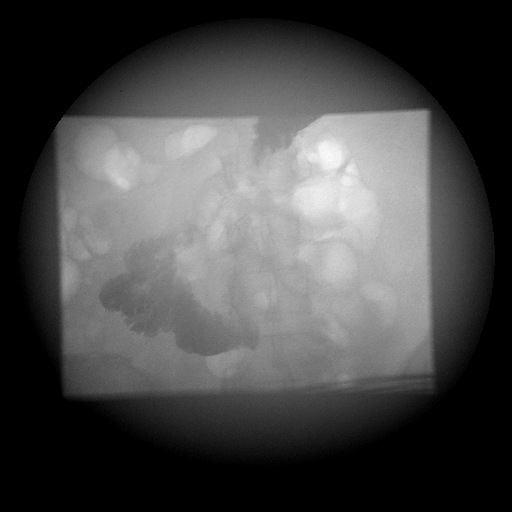

[Series 14: run · 1 of 1 slices shown (14 of 14)]
[im 1/1]
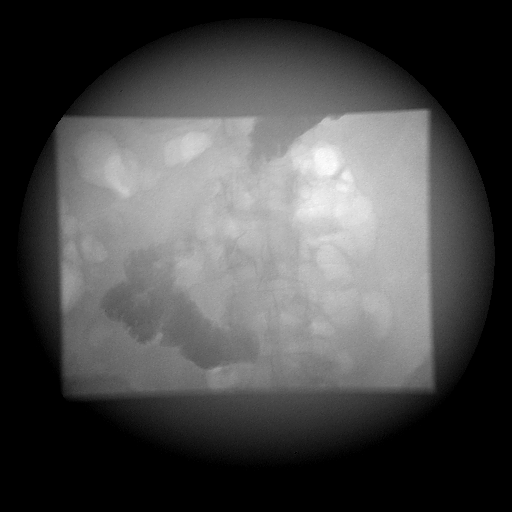

[14 of 14 positions shown; findings below may reference images not displayed]

FINDINGS: Scout images of the abdomen demonstrate loops of
prominent small bowel in the left mid abdomen.  Gas and stool seen
throughout the colon.  There is no intra-abdominal free air
identified.

There is no evidence of leak.  Contrast flows from the residual
gastric lumen into the afferent loop without difficulty.  A small
amount of reflux is seen throughout the course of the
examination.
IMPRESSION: Postsurgical changes of gastric bypass without evidence of leak.

## 2012-04-30 ENCOUNTER — Telehealth (INDEPENDENT_AMBULATORY_CARE_PROVIDER_SITE_OTHER): Payer: Self-pay | Admitting: Surgery

## 2012-04-30 NOTE — Telephone Encounter (Signed)
04/30/12 lm and mailed recall letter for pt to schedule a bariatric follow-up appt. Dr. Daphine Deutscher did [facility] 09/27/09. (lss)

## 2012-05-07 ENCOUNTER — Encounter (HOSPITAL_COMMUNITY): Payer: Self-pay

## 2012-05-07 ENCOUNTER — Emergency Department (HOSPITAL_COMMUNITY)
Admission: EM | Admit: 2012-05-07 | Discharge: 2012-05-07 | Disposition: A | Discharge status: Home / Self Care | Payer: BC Managed Care – PPO | Attending: Emergency Medicine | Admitting: Emergency Medicine

## 2012-05-07 DIAGNOSIS — E119 Type 2 diabetes mellitus without complications: Secondary | ICD-10-CM | POA: Insufficient documentation

## 2012-05-07 DIAGNOSIS — R51 Headache: Secondary | ICD-10-CM | POA: Insufficient documentation

## 2012-05-07 DIAGNOSIS — R42 Dizziness and giddiness: Secondary | ICD-10-CM | POA: Insufficient documentation

## 2012-05-07 DIAGNOSIS — R9431 Abnormal electrocardiogram [ECG] [EKG]: Secondary | ICD-10-CM | POA: Insufficient documentation

## 2012-05-07 DIAGNOSIS — I10 Essential (primary) hypertension: Secondary | ICD-10-CM | POA: Insufficient documentation

## 2012-05-07 DIAGNOSIS — F411 Generalized anxiety disorder: Secondary | ICD-10-CM | POA: Insufficient documentation

## 2012-05-07 DIAGNOSIS — Z79899 Other long term (current) drug therapy: Secondary | ICD-10-CM | POA: Insufficient documentation

## 2012-05-07 HISTORY — DX: Essential (primary) hypertension: I10

## 2012-05-07 HISTORY — DX: Type 2 diabetes mellitus without complications: E11.9

## 2012-05-07 HISTORY — DX: Anxiety disorder, unspecified: F41.9

## 2012-05-07 LAB — CBC WITH DIFFERENTIAL/PLATELET
Basophils Absolute: 0 10*3/uL (ref 0.0–0.1)
Eosinophils Absolute: 0.2 10*3/uL (ref 0.0–0.7)
Lymphocytes Relative: 33 % (ref 12–46)
Lymphs Abs: 3 10*3/uL (ref 0.7–4.0)
MCH: 29.8 pg (ref 26.0–34.0)
Neutrophils Relative %: 59 % (ref 43–77)
Platelets: 295 10*3/uL (ref 150–400)
RBC: 5.03 MIL/uL (ref 3.87–5.11)
RDW: 12.4 % (ref 11.5–15.5)
WBC: 9.2 10*3/uL (ref 4.0–10.5)

## 2012-05-07 LAB — POCT I-STAT, CHEM 8
BUN: 16 mg/dL (ref 6–23)
Calcium, Ion: 1.15 mmol/L (ref 1.12–1.23)
HCT: 45 % (ref 36.0–46.0)
Hemoglobin: 15.3 g/dL — ABNORMAL HIGH (ref 12.0–15.0)
Sodium: 142 mEq/L (ref 135–145)
TCO2: 30 mmol/L (ref 0–100)

## 2012-05-07 LAB — POCT I-STAT TROPONIN I: Troponin i, poc: 0 ng/mL (ref 0.00–0.08)

## 2012-05-07 LAB — URINALYSIS, ROUTINE W REFLEX MICROSCOPIC
Glucose, UA: NEGATIVE mg/dL
Leukocytes, UA: NEGATIVE
Nitrite: NEGATIVE
Protein, ur: NEGATIVE mg/dL

## 2012-05-07 NOTE — ED Provider Notes (Signed)
Medical screening examination/treatment/procedure(s) were performed by non-physician practitioner and as supervising physician I was immediately available for consultation/collaboration.  Madelaine Whipple L Rabiah Goeser, MD 05/07/12 1709 

## 2012-05-07 NOTE — ED Provider Notes (Signed)
History     CSN: [id number]  Arrival date & time 05/07/12  1033   First MD Initiated Contact with Patient 05/07/12 1050      Chief Complaint  Patient presents with  . Dizziness    (Consider location/radiation/quality/duration/timing/severity/associated sxs/prior treatment) HPI [name] is a 47 y.o. female who presents to ED with an episode of dizziness. 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 States episode lasted about an hour, was not improving, so called EMS. When EMS got there, she was asymptomatic. Denies associated headache, chest pain, shortness of breath, numbness or weakness of extremities, no palpitations. PT states she ate a protein bar this morning for breakfast. States. Pt admits to recent stress, not sure if this is related. Pt states symptoms resolved on its own. States no hx of the same.  Past Medical History  Diagnosis Date  . Hypertension   . Diabetes mellitus without complication   . Anxiety     Past Surgical History  Procedure Laterality Date  . Gastric bypass      No family history on file.  History  Substance Use Topics  . Smoking status: Not on file  . Smokeless tobacco: Not on file  . Alcohol Use: Not on file    OB History   Grav Para Term Preterm Abortions TAB SAB Ect Mult Living                  Review of Systems  Constitutional: Negative for fever and chills.  HENT: Negative for neck pain and neck stiffness.   Eyes: Negative for visual disturbance.  Respiratory: Negative.   Cardiovascular: Negative.   Gastrointestinal: Negative.   Genitourinary: Negative.   Musculoskeletal: Negative.   Neurological: Positive for dizziness, light-headedness and headaches. Negative for syncope, speech difficulty, weakness and numbness.  All other systems reviewed and are negative.    Allergies  Review of patient's allergies indicates no known allergies.  Home Medications   Current Outpatient Rx  Name  Route  Sig  Dispense   Refill  . Multiple Vitamin (MULTIVITAMIN) LIQD   Oral   Take 5 mLs by mouth daily.         Marland Kitchen venlafaxine (EFFEXOR) 75 MG tablet   Oral   Take 75 mg by mouth daily.           BP 141/87  Pulse 94  Temp(Src) 98.4 F (36.9 C) (Oral)  Resp 18  SpO2 100%  Physical Exam  Nursing note and vitals reviewed. Constitutional: She is oriented to person, place, and time. She appears well-developed and well-nourished.  HENT:  Head: Normocephalic.  Eyes: Conjunctivae and EOM are normal. Pupils are equal, round, and reactive to light.  Neck: Normal range of motion. Neck supple.  Cardiovascular: Normal rate, regular rhythm and normal heart sounds.   Pulmonary/Chest: Effort normal and breath sounds normal. No respiratory distress. She has no wheezes. She has no rales.  Abdominal: Soft. Bowel sounds are normal. She exhibits no distension. There is no tenderness. There is no rebound.  Musculoskeletal: She exhibits no edema.  Neurological: She is alert and oriented to person, place, and time. No cranial nerve deficit. Coordination normal.  Skin: Skin is warm. No rash noted. She is not diaphoretic.  Psychiatric: She has a normal mood and affect. Her behavior is normal.    ED Course  Procedures (including critical care time)  11:09 AM Pt seen and examined. She is currently symptom free. She has no hx of the same. VS normal  here. Will get labs, ECG, monitor.    Date: 05/07/2012  Rate: 74  Rhythm: normal sinus rhythm  QRS Axis: normal  Intervals: PR shortened  ST/T Wave abnormalities: normal  Conduction Disutrbances:none  Narrative Interpretation: Early precordial R/S transition  Old EKG Reviewed: changes noted new short PR  Results for orders placed during the hospital encounter of 05/07/12  CBC WITH DIFFERENTIAL      Result Value Range   WBC 9.2  4.0 - 10.5 K/uL   RBC 5.03  3.87 - 5.11 MIL/uL   Hemoglobin 15.0  12.0 - 15.0 g/dL   HCT 16.1  09.6 - 04.5 %   MCV 85.5  78.0 - 100.0 fL    MCH 29.8  26.0 - 34.0 pg   MCHC 34.9  30.0 - 36.0 g/dL   RDW 40.9  81.1 - 91.4 %   Platelets 295  150 - 400 K/uL   Neutrophils Relative 59  43 - 77 %   Neutro Abs 5.4  1.7 - 7.7 K/uL   Lymphocytes Relative 33  12 - 46 %   Lymphs Abs 3.0  0.7 - 4.0 K/uL   Monocytes Relative 7  3 - 12 %   Monocytes Absolute 0.6  0.1 - 1.0 K/uL   Eosinophils Relative 2  0 - 5 %   Eosinophils Absolute 0.2  0.0 - 0.7 K/uL   Basophils Relative 0  0 - 1 %   Basophils Absolute 0.0  0.0 - 0.1 K/uL  URINALYSIS, ROUTINE W REFLEX MICROSCOPIC      Result Value Range   Color, Urine YELLOW  YELLOW   APPearance CLEAR  CLEAR   Specific Gravity, Urine 1.016  1.005 - 1.030   pH 7.0  5.0 - 8.0   Glucose, UA NEGATIVE  NEGATIVE mg/dL   Hgb urine dipstick NEGATIVE  NEGATIVE   Bilirubin Urine NEGATIVE  NEGATIVE   Ketones, ur NEGATIVE  NEGATIVE mg/dL   Protein, ur NEGATIVE  NEGATIVE mg/dL   Urobilinogen, UA 0.2  0.0 - 1.0 mg/dL   Nitrite NEGATIVE  NEGATIVE   Leukocytes, UA NEGATIVE  NEGATIVE  POCT I-STAT, CHEM 8      Result Value Range   Sodium 142  135 - 145 mEq/L   Potassium 4.3  3.5 - 5.1 mEq/L   Chloride 103  96 - 112 mEq/L   BUN 16  6 - 23 mg/dL   Creatinine, Ser 7.82  0.50 - 1.10 mg/dL   Glucose, Bld 78  70 - 99 mg/dL   Calcium, Ion 9.56  2.13 - 1.23 mmol/L   TCO2 30  0 - 100 mmol/L   Hemoglobin 15.3 (*) 12.0 - 15.0 g/dL   HCT 08.6  57.8 - 46.9 %  POCT I-STAT TROPONIN I      Result Value Range   Troponin i, poc 0.00  0.00 - 0.08 ng/mL   Comment 3            No results found.   \  No diagnosis found.    MDM  PT with episode of dizziness prior the arrival. Once arrived, completely symptom free. She is otherwise healthy. ECG showed early precordial R/S transition and short PR interval. She had no associated symptoms with this event, specifically no CP or palpitations, no focal neuro deficits. Labs here unremarkable.   Concerned for possible arrhythmia. Discussed with Dr. Myrtis Ser, cardiology.  Recommended close follow up with PCP in the next 3 days, and follow up with cardiology on non  emergent basis.   Pt continues to be symptom free. Not orthostatic. Agrees with the plan to d/chome with close follow up. Instructed to return if worsening.   Filed Vitals:   05/07/12 1100 05/07/12 1127 05/07/12 1130 05/07/12 1131  BP: 104/80 134/78 135/91 134/83  Pulse: 77 76 84 89  Temp:      TempSrc:      Resp: 14     SpO2: 99%              Jailah Willis A Adrian Dinovo, PA-C 05/07/12 1311

## 2012-05-07 NOTE — ED Notes (Signed)
Per EMS pt c/o dizziness "heavy headed" x1.5hrs while sitting, states became flushed and diaphoretic. deneis N/V, neg stroke screen, a/o x4.

## 2012-05-07 NOTE — ED Notes (Signed)
ZOX:[id number]<AV> Expected date:<BR> Expected time:<BR> Means of arrival:<BR> Comments:<BR> 47yo-f-dizzy/hypertensive

## 2014-01-22 ENCOUNTER — Other Ambulatory Visit: Payer: Self-pay | Admitting: Obstetrics and Gynecology

## 2014-01-23 LAB — CYTOLOGY - PAP

## 2014-10-09 ENCOUNTER — Other Ambulatory Visit: Payer: Self-pay | Admitting: Gastroenterology

## 2015-02-04 ENCOUNTER — Other Ambulatory Visit: Payer: Self-pay | Admitting: Internal Medicine

## 2015-02-04 ENCOUNTER — Ambulatory Visit
Admission: RE | Admit: 2015-02-04 | Discharge: 2015-02-04 | Disposition: A | Discharge status: Home / Self Care | Payer: BC Managed Care – PPO | Source: Ambulatory Visit | Attending: Internal Medicine | Admitting: Internal Medicine

## 2015-02-04 DIAGNOSIS — J40 Bronchitis, not specified as acute or chronic: Secondary | ICD-10-CM

## 2015-10-22 ENCOUNTER — Encounter (HOSPITAL_COMMUNITY): Payer: Self-pay

## 2016-05-01 ENCOUNTER — Encounter (HOSPITAL_COMMUNITY): Payer: Self-pay

## 2017-04-27 ENCOUNTER — Encounter (HOSPITAL_COMMUNITY): Payer: Self-pay

## 2017-07-01 IMAGING — CR DG CHEST 2V
2 series · 2 of 2 positions shown · non-contrast
Comparison: Two-view chest x-ray 07/07/2009

CLINICAL DATA: Productive cough. Chest congestion. Shortness
breath.

EXAM:
CHEST - 2 VIEW

[w chest pa]
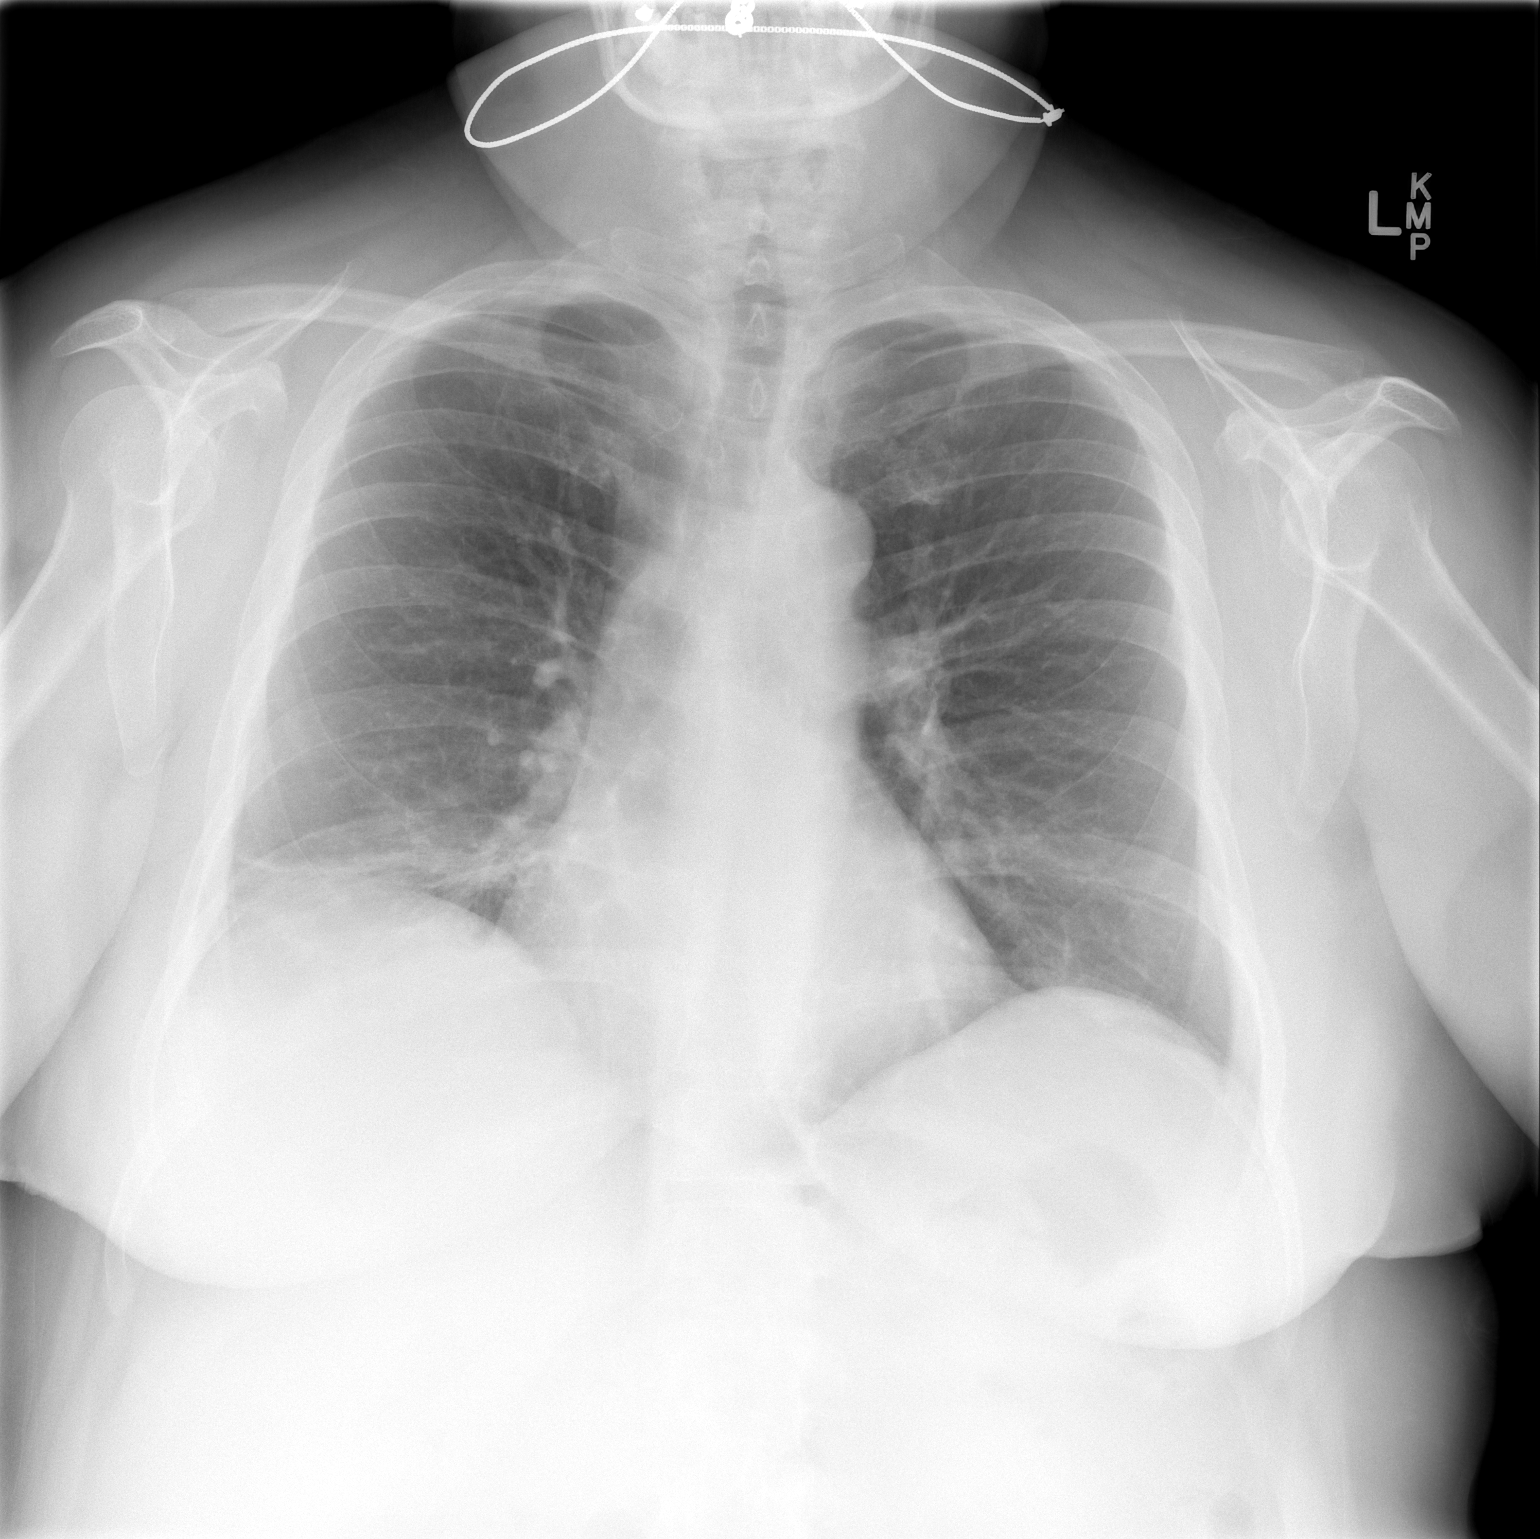

[w chest lat]
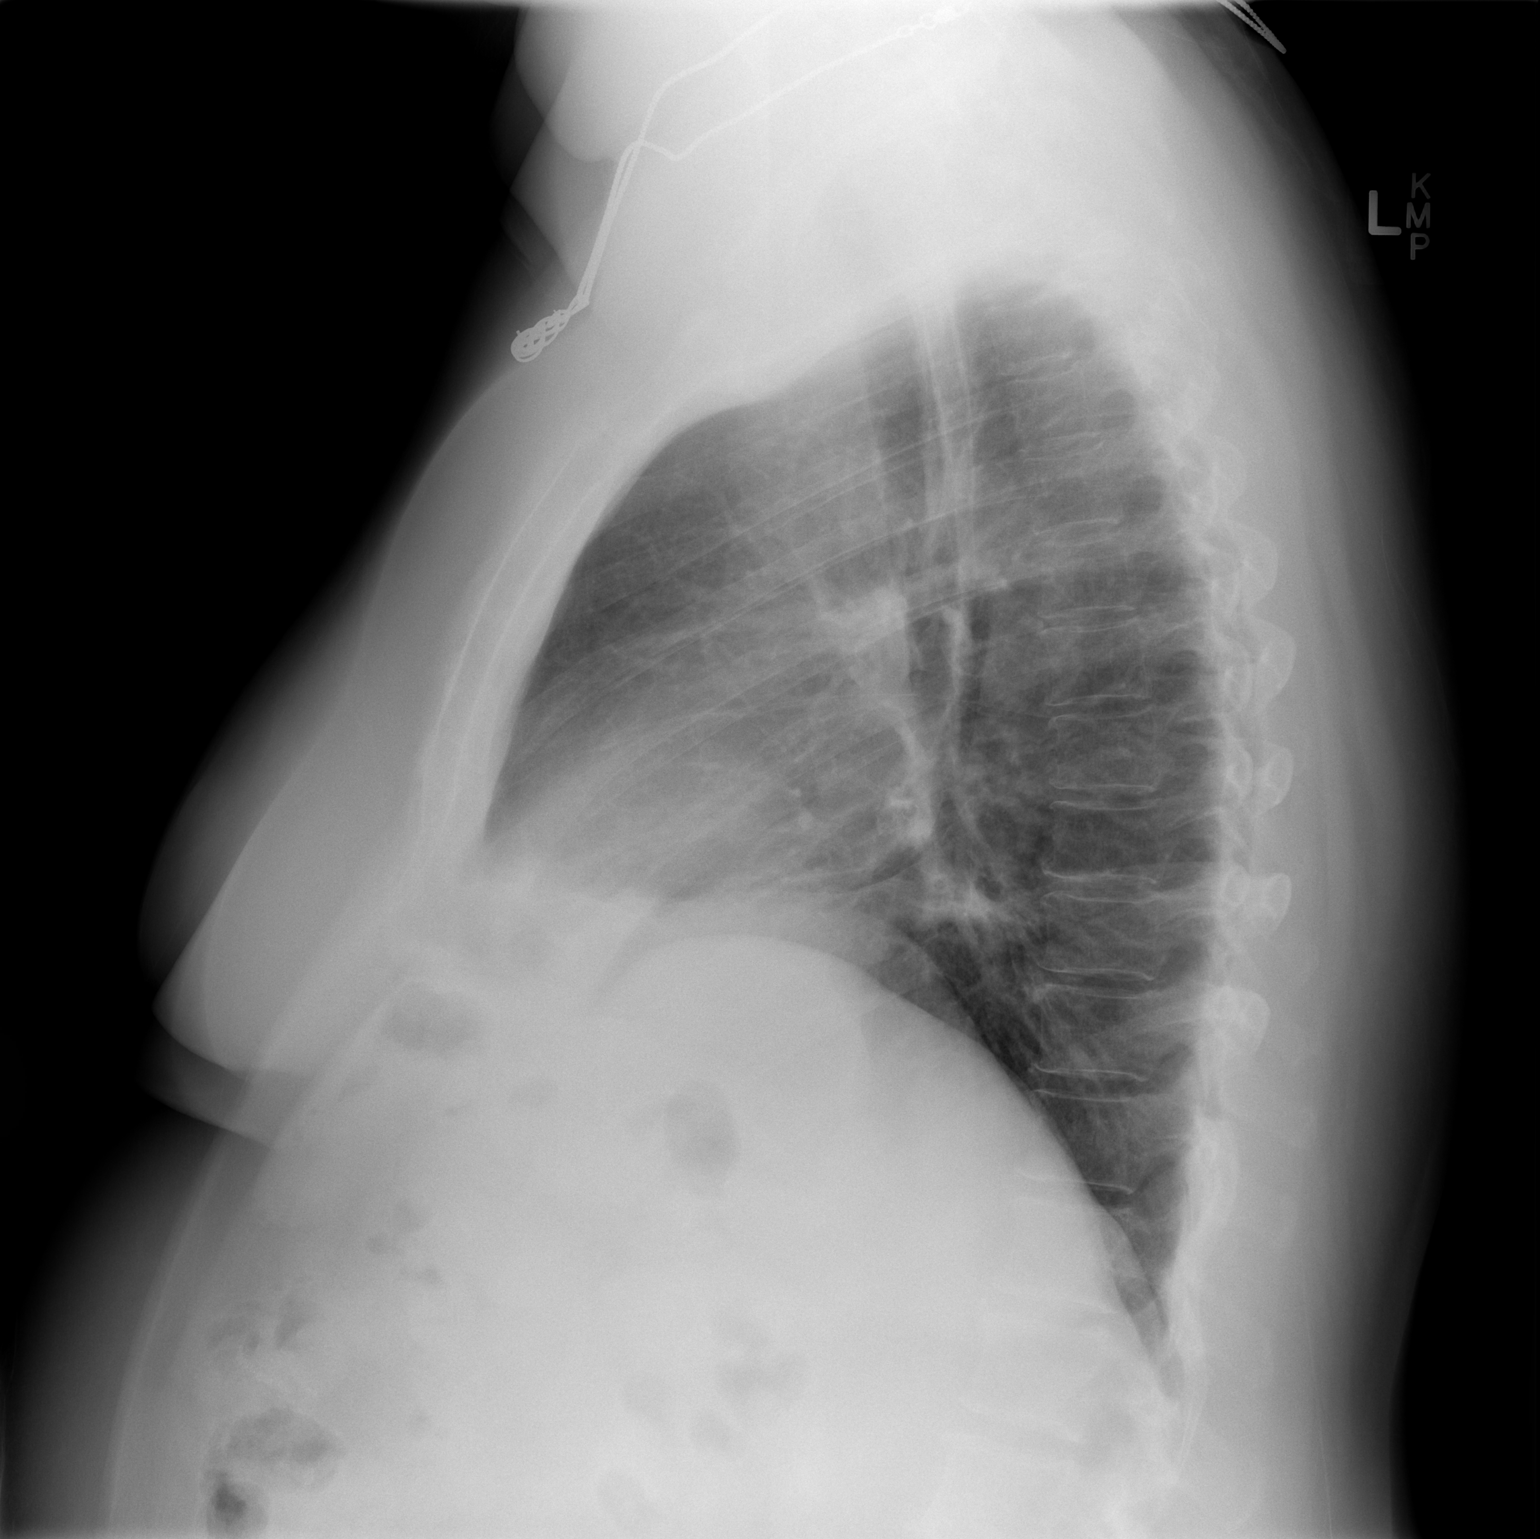

[2 of 2 positions shown; findings below may reference images not displayed]

FINDINGS: The heart size is normal. The right hemidiaphragm is chronically
elevated. New right middle lobe airspace disease is present. Minimal
atelectasis is present at the left base. There are no effusions or
edema. The visualized soft tissues and bony thorax are unremarkable.
IMPRESSION: 1. The right middle lobe airspace disease. While this may represent
atelectasis, infection is also included in the differential
diagnosis.
2. Minimal left basilar atelectasis or scarring is stable.

## 2021-06-29 ENCOUNTER — Telehealth: Payer: Self-pay | Admitting: Physician Assistant

## 2021-06-29 NOTE — Telephone Encounter (Signed)
Scheduled appt per 6/14 referral. Pt is aware of appt date and time. Pt is aware to arrive 15 mins prior to appt time and to bring and updated insurance card. Pt is aware of appt location.   

## 2021-07-15 ENCOUNTER — Other Ambulatory Visit: Payer: Self-pay | Admitting: Physician Assistant

## 2021-07-15 DIAGNOSIS — D72829 Elevated white blood cell count, unspecified: Secondary | ICD-10-CM

## 2021-07-15 NOTE — Progress Notes (Signed)
[facility] [phone]   [phone]  CONSULT NOTE  REFERRING PHYSICIAN: Dr. Nehemiah Settle  REASON FOR CONSULTATION:  Leukocytosis  HPI [name] is a 56 y.o. female with a past medical history significant for hyperlipidemia, sleep apnea, obesity, diabetes, polycystic ovarian syndrome, hypertension, and insomnia is referred to the clinic for leukocytosis.  The patient had an annual wellness visit with her primary care provider on 06/03/2021.  She was feeling well at that time.  She had routine blood work performed which showed slightly elevated white count at 12.5 and mild anemia with a hemoglobin of 11.6.  She had Hemoccult cards performed which were***.  She had a lab follow-up visit a few weeks later.  Her repeat labs were performed on 06/27/2021.  Her white blood cells continue to be mildly elevated at 13.4.  Her hemoglobin was normal at 12.6.  Her platelet count was slightly elevated at 431.  Her lymphocytes were slightly elevated at 3.8.  She is referred to the clinic regarding these findings.  The oldest records I have date back to 2007. Her WBC count was 10.7 at that time. In 2011 her WBC ranged from  7.9-14.2. Otherwise, I have labs from 2014 that are WNL> Primarily, the patient has elevated *** Overall, the patient is feeling fair except ***.  Infection symptoms?   She denies any fever with her frequent infections.  Fever, chills, night sweats, unexplained weight loss.Inflammatory conditions? Rashes, rhematology, diarrhea.  She denies any frequent urinary tract infections or dysuria.  Skin infectious, Sinus infections.    She denies taking any inhalers containing steroids, frequent steroid use, or hormone use.  Denies any nausea or vomiting.  Denies any lymphadenopathy.  Allergies.  Denies any over-the-counter herbal supplements.  Denies any epistaxis, gingival bleeding, hemoptysis, hematemesis, melena, or hematochezia.  HPI  Past Medical History:   Diagnosis Date   Anxiety    Diabetes mellitus without complication (HCC)    Hypertension     Past Surgical History:  Procedure Laterality Date   GASTRIC BYPASS      No family history on file.  Social History    No Known Allergies  Current Outpatient Medications  Medication Sig Dispense Refill   Multiple Vitamin (MULTIVITAMIN) LIQD Take 5 mLs by mouth daily.     venlafaxine (EFFEXOR) 75 MG tablet Take 75 mg by mouth daily.     No current facility-administered medications for this visit.    REVIEW OF SYSTEMS:   Review of Systems  Constitutional: Negative for appetite change, chills, fatigue, fever and unexpected weight change.  HENT:   Negative for mouth sores, nosebleeds, sore throat and trouble swallowing.   Eyes: Negative for eye problems and icterus.  Respiratory: Negative for cough, hemoptysis, shortness of breath and wheezing.   Cardiovascular: Negative for chest pain and leg swelling.  Gastrointestinal: Negative for abdominal pain, constipation, diarrhea, nausea and vomiting.  Genitourinary: Negative for bladder incontinence, difficulty urinating, dysuria, frequency and hematuria.   Musculoskeletal: Negative for back pain, gait problem, neck pain and neck stiffness.  Skin: Negative for itching and rash.  Neurological: Negative for dizziness, extremity weakness, gait problem, headaches, light-headedness and seizures.  Hematological: Negative for adenopathy. Does not bruise/bleed easily.  Psychiatric/Behavioral: Negative for confusion, depression and sleep disturbance. The patient is not nervous/anxious.     PHYSICAL EXAMINATION:  There were no vitals taken for this visit.  ECOG PERFORMANCE STATUS: {CHL ONC ECOG [id number]  Physical Exam  Constitutional: Oriented to person, place, and  time and well-developed, well-nourished, and in no distress. No distress.  HENT:  Head: Normocephalic and atraumatic.  Mouth/Throat: Oropharynx is clear and moist. No  oropharyngeal exudate.  Eyes: Conjunctivae are normal. Right eye exhibits no discharge. Left eye exhibits no discharge. No scleral icterus.  Neck: Normal range of motion. Neck supple.  Cardiovascular: Normal rate, regular rhythm, normal heart sounds and intact distal pulses.   Pulmonary/Chest: Effort normal and breath sounds normal. No respiratory distress. No wheezes. No rales.  Abdominal: Soft. Bowel sounds are normal. Exhibits no distension and no mass. There is no tenderness.  Musculoskeletal: Normal range of motion. Exhibits no edema.  Lymphadenopathy:    No cervical adenopathy.  Neurological: Alert and oriented to person, place, and time. Exhibits normal muscle tone. Gait normal. Coordination normal.  Skin: Skin is warm and dry. No rash noted. Not diaphoretic. No erythema. No pallor.  Psychiatric: Mood, memory and judgment normal.  Vitals reviewed.  LABORATORY DATA: Lab Results  Component Value Date   WBC 9.2 05/07/2012   HGB 15.3 (H) 05/07/2012   HCT 45.0 05/07/2012   MCV 85.5 05/07/2012   PLT 295 05/07/2012      Chemistry      Component Value Date/Time   NA 142 05/07/2012 1119   K 4.3 05/07/2012 1119   CL 103 05/07/2012 1119   CO2 26 09/21/2009 0840   BUN 16 05/07/2012 1119   CREATININE 0.60 05/07/2012 1119      Component Value Date/Time   CALCIUM 9.5 09/21/2009 0840   ALKPHOS 56 09/21/2009 0840   AST 99 (H) 09/21/2009 0840   ALT 152 (H) 09/21/2009 0840   BILITOT 0.8 09/21/2009 0840       RADIOGRAPHIC STUDIES: No results found.  ASSESSMENT: This is a very pleasant 56 year old female referred to clinic for leukocytosis.  The patient had several lab studies performed today including CBC, CMP, LDH, BCR/ABL testing.  The patient's labs from today demonstrate ***.  Dr. Arbutus Ped feels that the patient's leukocytosis is likely reactive but we will arrange for PCR testing to rule out myeloproliferative disorder.   I expect the results of this in the next 10 to 14  days.  If normal, Dr. Arbutus Ped discussed that this is likely benign and secondary to ***.  If abnormal, we call the patient sooner and arrange follow-up.    The patient voices understanding of current disease status and treatment options and is in agreement with the current care plan.  All questions were answered. The patient knows to call the clinic with any problems, questions or concerns. We can certainly see the patient much sooner if necessary.  Thank you so much for allowing me to participate in the care of [name]. I will continue to follow up the patient with you and assist in her care.  I spent {CHL ONC TIME VISIT - DGLOV:[id number]} counseling the patient face to face. The total time spent in the appointment was {CHL ONC TIME VISIT - ACZYS:[id number]}.  Disclaimer: This note was dictated with voice recognition software. Similar sounding words can inadvertently be transcribed and may not be corrected upon review.   Nazanin Kinner L Vennela Jutte July 15, 2021, 8:20 AM

## 2021-07-18 ENCOUNTER — Other Ambulatory Visit: Payer: Self-pay | Admitting: Physician Assistant

## 2021-07-18 ENCOUNTER — Inpatient Hospital Stay: Payer: BC Managed Care – PPO | Attending: Physician Assistant | Admitting: Physician Assistant

## 2021-07-18 ENCOUNTER — Other Ambulatory Visit: Payer: Self-pay | Admitting: Internal Medicine

## 2021-07-18 ENCOUNTER — Other Ambulatory Visit: Payer: Self-pay

## 2021-07-18 ENCOUNTER — Inpatient Hospital Stay: Payer: BC Managed Care – PPO

## 2021-07-18 VITALS — BP 122/84 | HR 73 | Temp 98.1°F | Resp 16 | Ht 61.0 in | Wt 199.7 lb

## 2021-07-18 DIAGNOSIS — Z8542 Personal history of malignant neoplasm of other parts of uterus: Secondary | ICD-10-CM

## 2021-07-18 DIAGNOSIS — Z803 Family history of malignant neoplasm of breast: Secondary | ICD-10-CM

## 2021-07-18 DIAGNOSIS — D649 Anemia, unspecified: Secondary | ICD-10-CM | POA: Diagnosis not present

## 2021-07-18 DIAGNOSIS — D72829 Elevated white blood cell count, unspecified: Secondary | ICD-10-CM

## 2021-07-18 DIAGNOSIS — Z87891 Personal history of nicotine dependence: Secondary | ICD-10-CM

## 2021-07-18 DIAGNOSIS — Z808 Family history of malignant neoplasm of other organs or systems: Secondary | ICD-10-CM

## 2021-07-18 DIAGNOSIS — E119 Type 2 diabetes mellitus without complications: Secondary | ICD-10-CM | POA: Diagnosis not present

## 2021-07-18 DIAGNOSIS — I10 Essential (primary) hypertension: Secondary | ICD-10-CM | POA: Diagnosis not present

## 2021-07-18 LAB — CBC WITH DIFFERENTIAL (CANCER CENTER ONLY)
Abs Immature Granulocytes: 0.03 10*3/uL (ref 0.00–0.07)
Basophils Absolute: 0.1 10*3/uL (ref 0.0–0.1)
Basophils Relative: 1 %
Eosinophils Absolute: 0.1 10*3/uL (ref 0.0–0.5)
Eosinophils Relative: 1 %
HCT: 40.5 % (ref 36.0–46.0)
Hemoglobin: 13.1 g/dL (ref 12.0–15.0)
Immature Granulocytes: 0 %
Lymphocytes Relative: 31 %
Lymphs Abs: 2.8 10*3/uL (ref 0.7–4.0)
MCH: 27.3 pg (ref 26.0–34.0)
MCHC: 32.3 g/dL (ref 30.0–36.0)
MCV: 84.4 fL (ref 80.0–100.0)
Monocytes Absolute: 0.4 10*3/uL (ref 0.1–1.0)
Monocytes Relative: 5 %
Neutro Abs: 5.6 10*3/uL (ref 1.7–7.7)
Neutrophils Relative %: 62 %
Platelet Count: 353 10*3/uL (ref 150–400)
RBC: 4.8 MIL/uL (ref 3.87–5.11)
RDW: 13.3 % (ref 11.5–15.5)
WBC Count: 9 10*3/uL (ref 4.0–10.5)
nRBC: 0 % (ref 0.0–0.2)

## 2021-07-18 LAB — CMP (CANCER CENTER ONLY)
ALT: 13 U/L (ref 0–44)
AST: 13 U/L — ABNORMAL LOW (ref 15–41)
Albumin: 4.2 g/dL (ref 3.5–5.0)
Alkaline Phosphatase: 67 U/L (ref 38–126)
Anion gap: 6 (ref 5–15)
BUN: 14 mg/dL (ref 6–20)
CO2: 29 mmol/L (ref 22–32)
Calcium: 9.4 mg/dL (ref 8.9–10.3)
Chloride: 106 mmol/L (ref 98–111)
Creatinine: 0.82 mg/dL (ref 0.44–1.00)
GFR, Estimated: >60 mL/min (ref >60)
Glucose, Bld: 115 mg/dL — ABNORMAL HIGH (ref 70–99)
Potassium: 3.7 mmol/L (ref 3.5–5.1)
Sodium: 141 mmol/L (ref 135–145)
Total Bilirubin: 0.3 mg/dL (ref 0.3–1.2)
Total Protein: 7.2 g/dL (ref 6.5–8.1)

## 2021-07-18 LAB — LACTATE DEHYDROGENASE: LDH: 138 U/L (ref 98–192)

## 2021-10-17 ENCOUNTER — Inpatient Hospital Stay: Payer: BC Managed Care – PPO

## 2021-10-17 ENCOUNTER — Inpatient Hospital Stay: Payer: BC Managed Care – PPO | Admitting: Internal Medicine

## 2021-10-27 ENCOUNTER — Other Ambulatory Visit: Payer: Self-pay

## 2021-10-27 ENCOUNTER — Inpatient Hospital Stay: Payer: BC Managed Care – PPO | Admitting: Internal Medicine

## 2021-10-27 ENCOUNTER — Inpatient Hospital Stay: Payer: BC Managed Care – PPO

## 2021-10-27 ENCOUNTER — Inpatient Hospital Stay: Payer: BC Managed Care – PPO | Attending: Physician Assistant

## 2021-10-27 VITALS — BP 123/82 | HR 85 | Temp 98.4°F | Resp 16 | Wt 205.6 lb

## 2021-10-27 DIAGNOSIS — D72829 Elevated white blood cell count, unspecified: Secondary | ICD-10-CM | POA: Insufficient documentation

## 2021-10-27 DIAGNOSIS — E119 Type 2 diabetes mellitus without complications: Secondary | ICD-10-CM | POA: Diagnosis not present

## 2021-10-27 DIAGNOSIS — I10 Essential (primary) hypertension: Secondary | ICD-10-CM | POA: Insufficient documentation

## 2021-10-27 LAB — CBC WITH DIFFERENTIAL (CANCER CENTER ONLY)
Abs Immature Granulocytes: 0.05 10*3/uL (ref 0.00–0.07)
Basophils Absolute: 0.1 10*3/uL (ref 0.0–0.1)
Basophils Relative: 1 %
Eosinophils Absolute: 0.2 10*3/uL (ref 0.0–0.5)
Eosinophils Relative: 2 %
HCT: 39.9 % (ref 36.0–46.0)
Hemoglobin: 13 g/dL (ref 12.0–15.0)
Immature Granulocytes: 0 %
Lymphocytes Relative: 30 %
Lymphs Abs: 3.8 10*3/uL (ref 0.7–4.0)
MCH: 26.8 pg (ref 26.0–34.0)
MCHC: 32.6 g/dL (ref 30.0–36.0)
MCV: 82.3 fL (ref 80.0–100.0)
Monocytes Absolute: 0.8 10*3/uL (ref 0.1–1.0)
Monocytes Relative: 6 %
Neutro Abs: 7.8 10*3/uL — ABNORMAL HIGH (ref 1.7–7.7)
Neutrophils Relative %: 61 %
Platelet Count: 384 10*3/uL (ref 150–400)
RBC: 4.85 MIL/uL (ref 3.87–5.11)
RDW: 14 % (ref 11.5–15.5)
WBC Count: 12.7 10*3/uL — ABNORMAL HIGH (ref 4.0–10.5)
nRBC: 0 % (ref 0.0–0.2)

## 2021-10-27 LAB — CMP (CANCER CENTER ONLY)
ALT: 14 U/L (ref 0–44)
AST: 14 U/L — ABNORMAL LOW (ref 15–41)
Albumin: 4.4 g/dL (ref 3.5–5.0)
Alkaline Phosphatase: 66 U/L (ref 38–126)
Anion gap: 7 (ref 5–15)
BUN: 16 mg/dL (ref 6–20)
CO2: 31 mmol/L (ref 22–32)
Calcium: 9.7 mg/dL (ref 8.9–10.3)
Chloride: 99 mmol/L (ref 98–111)
Creatinine: 0.77 mg/dL (ref 0.44–1.00)
GFR, Estimated: >60 mL/min (ref >60)
Glucose, Bld: 100 mg/dL — ABNORMAL HIGH (ref 70–99)
Potassium: 3.6 mmol/L (ref 3.5–5.1)
Sodium: 137 mmol/L (ref 135–145)
Total Bilirubin: 0.3 mg/dL (ref 0.3–1.2)
Total Protein: 7.5 g/dL (ref 6.5–8.1)

## 2021-10-27 NOTE — Progress Notes (Signed)
[facility] [phone]   Fax:(336) [phone]  OFFICE PROGRESS NOTE  Seward Carol, MD [address] [place] [address]  DIAGNOSIS: Persistent leukocytosis likely reactive in nature.  PRIOR THERAPY: None  CURRENT THERAPY: Observation  INTERVAL HISTORY: [name] 56 y.o. female returns to the clinic today for follow-up visit.  The patient is feeling fine today with no concerning complaints.  She denied having any chest pain, shortness of breath, cough or hemoptysis.  She denied having any fever or chills.  She has no nausea, vomiting, diarrhea or constipation.  She has no headache or visual changes.  She denied having any recent weight loss or night sweats.  She is here today for evaluation with repeat CBC and comprehensive metabolic panel.  MEDICAL HISTORY: Past Medical History:  Diagnosis Date   Anxiety    Diabetes mellitus without complication ([facility])    Hypertension     ALLERGIES:  has No Known Allergies.  MEDICATIONS:  Current Outpatient Medications  Medication Sig Dispense Refill   lisinopril-hydrochlorothiazide (ZESTORETIC) 10-12.5 MG tablet Take 1 tablet by mouth daily.     metFORMIN (GLUCOPHAGE-XR) 500 MG 24 hr tablet 1 TABLET BY MOUTH ONCE A DAY WITH FOOD 90 DAYS     Semaglutide, 1 MG/DOSE, (OZEMPIC, 1 MG/DOSE,) 4 MG/3ML SOPN 1 MG SUBCUTANEOUS ONCE A WEEK 30 DAYS     venlafaxine (EFFEXOR) 75 MG tablet Take 75 mg by mouth daily. Pt takes two 75mg  tablets daily     No current facility-administered medications for this visit.    SURGICAL HISTORY:  Past Surgical History:  Procedure Laterality Date   GASTRIC BYPASS      REVIEW OF SYSTEMS:  A comprehensive review of systems was negative.   PHYSICAL EXAMINATION: General appearance: alert, cooperative, and no distress Head: Normocephalic, without obvious abnormality, atraumatic Neck: no adenopathy, no JVD, supple, symmetrical, trachea midline, and thyroid not  enlarged, symmetric, no tenderness/mass/nodules Lymph nodes: Cervical, supraclavicular, and axillary nodes normal. Resp: clear to auscultation bilaterally Back: symmetric, no curvature. ROM normal. No CVA tenderness. Cardio: regular rate and rhythm, S1, S2 normal, no murmur, click, rub or gallop GI: soft, non-tender; bowel sounds normal; no masses,  no organomegaly Extremities: extremities normal, atraumatic, no cyanosis or edema  ECOG PERFORMANCE STATUS: 0 - Asymptomatic  Blood pressure 123/82, pulse 85, temperature 98.4 F (36.9 C), temperature source Oral, resp. rate 16, weight 205 lb 9 oz (93.2 kg), SpO2 95 %.  LABORATORY DATA: Lab Results  Component Value Date   WBC 12.7 (H) 10/27/2021   HGB 13.0 10/27/2021   HCT 39.9 10/27/2021   MCV 82.3 10/27/2021   PLT 384 10/27/2021      Chemistry      Component Value Date/Time   NA 141 07/18/2021 1312   K 3.7 07/18/2021 1312   CL 106 07/18/2021 1312   CO2 29 07/18/2021 1312   BUN 14 07/18/2021 1312   CREATININE 0.82 07/18/2021 1312      Component Value Date/Time   CALCIUM 9.4 07/18/2021 1312   ALKPHOS 67 07/18/2021 1312   AST 13 (L) 07/18/2021 1312   ALT 13 07/18/2021 1312   BILITOT 0.3 07/18/2021 1312       RADIOGRAPHIC STUDIES: No results found.  ASSESSMENT AND PLAN: This is a very pleasant 56 years old white female with intermittent leukocytosis likely reactive in nature but underlying myeloproliferative disorder could not be completely excluded. The patient had repeat CBC today that showed elevated  white blood count of 12.7 with absolute neutrophil count of 7800 and normal hemoglobin and hematocrit as well as platelet count. I recommended for the patient to have molecular studies for JAK2 mutation as well as FISH study for BCR/ABL to rule out any underlying myeloproliferative disorder. If these lab results are normal, I advised her to follow-up with her primary care physician and not to worry about the intermittent  leukocytosis which is likely to be reactive in nature but if she has abnormal test, I will call her sooner for evaluation and discussion of her treatment options. The patient is in agreement with the current plan. She was advised to call immediately if she has any other concerning symptoms in the interval. The patient voices understanding of current disease status and treatment options and is in agreement with the current care plan.  All questions were answered. The patient knows to call the clinic with any problems, questions or concerns. We can certainly see the patient much sooner if necessary.  The total time spent in the appointment was 20 minutes.  Disclaimer: This note was dictated with voice recognition software. Similar sounding words can inadvertently be transcribed and may not be corrected upon review.

## 2021-11-02 ENCOUNTER — Telehealth: Payer: Self-pay | Admitting: Internal Medicine

## 2021-11-02 LAB — BCR ABL1 FISH (GENPATH)

## 2021-11-02 LAB — JAK2 (INCLUDING V617F AND EXON 12), MPL,& CALR-NEXT GEN SEQ

## 2021-11-02 NOTE — Telephone Encounter (Signed)
I called the patient and updated her about the result of the molecular studies.  There was no answer and I left her a voicemail about the results that showed a variant of unknown significance and the JAK2 panel and negative for BCR/ABL. I advised her to follow-up with her primary care physician as previously planned and see [place] on as-needed basis.

## 2023-10-03 ENCOUNTER — Other Ambulatory Visit: Payer: Self-pay | Admitting: Obstetrics and Gynecology

## 2023-10-03 DIAGNOSIS — R928 Other abnormal and inconclusive findings on diagnostic imaging of breast: Secondary | ICD-10-CM

## 2023-10-15 ENCOUNTER — Inpatient Hospital Stay
Admission: RE | Admit: 2023-10-15 | Discharge: 2023-10-15 | Payer: Self-pay | Attending: Obstetrics and Gynecology | Admitting: Obstetrics and Gynecology

## 2023-10-15 ENCOUNTER — Ambulatory Visit

## 2023-10-15 DIAGNOSIS — R928 Other abnormal and inconclusive findings on diagnostic imaging of breast: Secondary | ICD-10-CM
